# Patient Record
Sex: Female | Born: 1940 | Race: White | Hispanic: No | Marital: Married | Smoking: Current every day smoker
Health system: Southern US, Community
[De-identification: ages and names within clinical notes are randomized; demographics above are authoritative.]

## PROBLEM LIST (undated history)

## (undated) DIAGNOSIS — Z9989 Dependence on other enabling machines and devices: Secondary | ICD-10-CM

## (undated) DIAGNOSIS — E785 Hyperlipidemia, unspecified: Secondary | ICD-10-CM

## (undated) DIAGNOSIS — F17200 Nicotine dependence, unspecified, uncomplicated: Secondary | ICD-10-CM

## (undated) DIAGNOSIS — R413 Other amnesia: Principal | ICD-10-CM

## (undated) DIAGNOSIS — M1990 Unspecified osteoarthritis, unspecified site: Secondary | ICD-10-CM

## (undated) DIAGNOSIS — G250 Essential tremor: Secondary | ICD-10-CM

## (undated) DIAGNOSIS — I10 Essential (primary) hypertension: Secondary | ICD-10-CM

## (undated) DIAGNOSIS — R443 Hallucinations, unspecified: Secondary | ICD-10-CM

## (undated) DIAGNOSIS — G4733 Obstructive sleep apnea (adult) (pediatric): Secondary | ICD-10-CM

## (undated) DIAGNOSIS — F329 Major depressive disorder, single episode, unspecified: Secondary | ICD-10-CM

## (undated) DIAGNOSIS — K449 Diaphragmatic hernia without obstruction or gangrene: Secondary | ICD-10-CM

## (undated) DIAGNOSIS — I2510 Atherosclerotic heart disease of native coronary artery without angina pectoris: Secondary | ICD-10-CM

## (undated) DIAGNOSIS — R32 Unspecified urinary incontinence: Secondary | ICD-10-CM

## (undated) HISTORY — DX: Diaphragmatic hernia without obstruction or gangrene: K44.9

## (undated) HISTORY — DX: Essential (primary) hypertension: I10

## (undated) HISTORY — DX: Essential tremor: G25.0

## (undated) HISTORY — DX: Unspecified osteoarthritis, unspecified site: M19.90

## (undated) HISTORY — DX: Atherosclerotic heart disease of native coronary artery without angina pectoris: I25.10

## (undated) HISTORY — DX: Hallucinations, unspecified: R44.3

## (undated) HISTORY — PX: TRIGGER FINGER RELEASE: SHX641

## (undated) HISTORY — DX: Obstructive sleep apnea (adult) (pediatric): G47.33

## (undated) HISTORY — DX: Major depressive disorder, single episode, unspecified: F32.9

## (undated) HISTORY — PX: FOOT SURGERY: SHX648

## (undated) HISTORY — PX: TOE SURGERY: SHX1073

## (undated) HISTORY — PX: TUBAL LIGATION: SHX77

## (undated) HISTORY — DX: Unspecified urinary incontinence: R32

## (undated) HISTORY — DX: Hyperlipidemia, unspecified: E78.5

## (undated) HISTORY — DX: Other amnesia: R41.3

## (undated) HISTORY — DX: Dependence on other enabling machines and devices: Z99.89

## (undated) HISTORY — DX: Nicotine dependence, unspecified, uncomplicated: F17.200

---

## 2000-07-21 ENCOUNTER — Other Ambulatory Visit: Admission: RE | Admit: 2000-07-21 | Discharge: 2000-07-21 | Payer: Self-pay | Admitting: Emergency Medicine

## 2000-07-21 ENCOUNTER — Encounter: Payer: Self-pay | Admitting: Emergency Medicine

## 2000-07-21 ENCOUNTER — Inpatient Hospital Stay (HOSPITAL_COMMUNITY): Admission: EM | Admit: 2000-07-21 | Discharge: 2000-07-22 | Payer: Self-pay | Admitting: Emergency Medicine

## 2000-07-22 ENCOUNTER — Encounter: Payer: Self-pay | Admitting: Cardiology

## 2000-07-31 ENCOUNTER — Encounter: Admission: RE | Admit: 2000-07-31 | Discharge: 2000-07-31 | Payer: Self-pay | Admitting: Emergency Medicine

## 2000-07-31 ENCOUNTER — Encounter: Payer: Self-pay | Admitting: Emergency Medicine

## 2000-12-25 ENCOUNTER — Ambulatory Visit (HOSPITAL_COMMUNITY): Admission: RE | Admit: 2000-12-25 | Discharge: 2000-12-25 | Payer: Self-pay | Admitting: Gastroenterology

## 2000-12-25 ENCOUNTER — Encounter (INDEPENDENT_AMBULATORY_CARE_PROVIDER_SITE_OTHER): Payer: Self-pay

## 2001-01-26 ENCOUNTER — Encounter (INDEPENDENT_AMBULATORY_CARE_PROVIDER_SITE_OTHER): Payer: Self-pay | Admitting: Specialist

## 2001-01-26 ENCOUNTER — Ambulatory Visit (HOSPITAL_COMMUNITY): Admission: RE | Admit: 2001-01-26 | Discharge: 2001-01-26 | Payer: Self-pay | Admitting: Gastroenterology

## 2001-07-27 ENCOUNTER — Ambulatory Visit (HOSPITAL_COMMUNITY): Admission: RE | Admit: 2001-07-27 | Discharge: 2001-07-27 | Payer: Self-pay | Admitting: Gastroenterology

## 2001-07-27 ENCOUNTER — Encounter (INDEPENDENT_AMBULATORY_CARE_PROVIDER_SITE_OTHER): Payer: Self-pay | Admitting: *Deleted

## 2001-12-24 ENCOUNTER — Encounter: Payer: Self-pay | Admitting: Unknown Physician Specialty

## 2001-12-24 ENCOUNTER — Encounter: Admission: RE | Admit: 2001-12-24 | Discharge: 2001-12-24 | Payer: Self-pay | Admitting: Unknown Physician Specialty

## 2002-05-10 ENCOUNTER — Encounter: Payer: Self-pay | Admitting: *Deleted

## 2002-05-10 ENCOUNTER — Encounter: Admission: RE | Admit: 2002-05-10 | Discharge: 2002-05-10 | Payer: Self-pay | Admitting: *Deleted

## 2007-07-22 ENCOUNTER — Ambulatory Visit: Payer: Self-pay | Admitting: Family Medicine

## 2007-09-17 ENCOUNTER — Ambulatory Visit: Payer: Self-pay | Admitting: Family Medicine

## 2007-09-17 ENCOUNTER — Other Ambulatory Visit: Admission: RE | Admit: 2007-09-17 | Discharge: 2007-09-17 | Payer: Self-pay | Admitting: Family Medicine

## 2007-09-17 ENCOUNTER — Encounter: Payer: Self-pay | Admitting: Family Medicine

## 2007-09-17 LAB — HM PAP SMEAR: HM Pap smear: NEGATIVE

## 2007-11-06 ENCOUNTER — Ambulatory Visit: Payer: Self-pay | Admitting: Family Medicine

## 2008-01-04 ENCOUNTER — Ambulatory Visit: Payer: Self-pay | Admitting: Family Medicine

## 2008-01-04 ENCOUNTER — Encounter: Admission: RE | Admit: 2008-01-04 | Discharge: 2008-01-04 | Payer: Self-pay | Admitting: Family Medicine

## 2008-03-14 ENCOUNTER — Encounter: Admission: RE | Admit: 2008-03-14 | Discharge: 2008-03-14 | Payer: Self-pay | Admitting: Gastroenterology

## 2008-03-31 ENCOUNTER — Encounter: Admission: RE | Admit: 2008-03-31 | Discharge: 2008-03-31 | Payer: Self-pay | Admitting: Family Medicine

## 2008-03-31 ENCOUNTER — Ambulatory Visit: Payer: Self-pay | Admitting: Family Medicine

## 2008-04-08 ENCOUNTER — Ambulatory Visit: Payer: Self-pay | Admitting: Cardiology

## 2008-04-08 ENCOUNTER — Encounter: Payer: Self-pay | Admitting: Cardiology

## 2008-04-08 DIAGNOSIS — E785 Hyperlipidemia, unspecified: Secondary | ICD-10-CM

## 2008-04-08 DIAGNOSIS — R0602 Shortness of breath: Secondary | ICD-10-CM

## 2008-04-08 DIAGNOSIS — E669 Obesity, unspecified: Secondary | ICD-10-CM

## 2008-04-08 DIAGNOSIS — R609 Edema, unspecified: Secondary | ICD-10-CM | POA: Insufficient documentation

## 2008-04-08 DIAGNOSIS — I359 Nonrheumatic aortic valve disorder, unspecified: Secondary | ICD-10-CM | POA: Insufficient documentation

## 2008-04-08 DIAGNOSIS — I10 Essential (primary) hypertension: Secondary | ICD-10-CM | POA: Insufficient documentation

## 2008-04-08 LAB — CONVERTED CEMR LAB: Pro B Natriuretic peptide (BNP): 80 pg/mL (ref 0.0–100.0)

## 2008-04-14 ENCOUNTER — Ambulatory Visit: Payer: Self-pay | Admitting: Family Medicine

## 2008-04-27 ENCOUNTER — Ambulatory Visit: Payer: Self-pay | Admitting: Family Medicine

## 2008-05-05 ENCOUNTER — Telehealth (INDEPENDENT_AMBULATORY_CARE_PROVIDER_SITE_OTHER): Payer: Self-pay

## 2008-05-09 ENCOUNTER — Encounter: Payer: Self-pay | Admitting: Cardiology

## 2008-05-09 ENCOUNTER — Ambulatory Visit: Payer: Self-pay

## 2008-05-09 ENCOUNTER — Ambulatory Visit: Payer: Self-pay | Admitting: Cardiology

## 2008-05-09 ENCOUNTER — Inpatient Hospital Stay (HOSPITAL_COMMUNITY): Admission: AD | Admit: 2008-05-09 | Discharge: 2008-05-11 | Payer: Self-pay | Admitting: Cardiology

## 2008-05-27 ENCOUNTER — Ambulatory Visit: Payer: Self-pay | Admitting: Cardiology

## 2008-05-27 ENCOUNTER — Encounter: Payer: Self-pay | Admitting: Cardiology

## 2008-06-03 ENCOUNTER — Ambulatory Visit: Payer: Self-pay | Admitting: Family Medicine

## 2008-08-26 ENCOUNTER — Ambulatory Visit: Payer: Self-pay | Admitting: Cardiology

## 2008-08-26 DIAGNOSIS — R5383 Other fatigue: Secondary | ICD-10-CM

## 2008-08-26 DIAGNOSIS — R5381 Other malaise: Secondary | ICD-10-CM

## 2008-08-26 DIAGNOSIS — I2510 Atherosclerotic heart disease of native coronary artery without angina pectoris: Secondary | ICD-10-CM | POA: Insufficient documentation

## 2008-08-30 ENCOUNTER — Ambulatory Visit: Payer: Self-pay | Admitting: Cardiology

## 2008-09-01 LAB — CONVERTED CEMR LAB
Albumin: 3.7 g/dL (ref 3.5–5.2)
Cholesterol: 168 mg/dL (ref 0–200)
Total CHOL/HDL Ratio: 4

## 2008-10-13 ENCOUNTER — Ambulatory Visit: Payer: Self-pay | Admitting: Family Medicine

## 2008-10-31 ENCOUNTER — Ambulatory Visit: Payer: Self-pay | Admitting: Cardiovascular Disease

## 2008-10-31 ENCOUNTER — Inpatient Hospital Stay (HOSPITAL_COMMUNITY): Admission: EM | Admit: 2008-10-31 | Discharge: 2008-11-01 | Payer: Self-pay | Admitting: Emergency Medicine

## 2008-11-03 ENCOUNTER — Ambulatory Visit: Payer: Self-pay | Admitting: Family Medicine

## 2008-11-15 ENCOUNTER — Encounter: Payer: Self-pay | Admitting: Cardiology

## 2008-11-16 ENCOUNTER — Ambulatory Visit: Payer: Self-pay | Admitting: Cardiology

## 2008-11-16 ENCOUNTER — Ambulatory Visit: Payer: Self-pay | Admitting: Cardiovascular Disease

## 2008-11-18 LAB — CONVERTED CEMR LAB
GFR calc non Af Amer: 58.91 mL/min (ref >60)
Potassium: 4.3 meq/L (ref 3.5–5.1)

## 2009-01-04 ENCOUNTER — Ambulatory Visit: Payer: Self-pay | Admitting: Family Medicine

## 2009-01-05 ENCOUNTER — Ambulatory Visit: Payer: Self-pay | Admitting: Cardiology

## 2009-02-14 ENCOUNTER — Ambulatory Visit: Payer: Self-pay | Admitting: Family Medicine

## 2009-02-22 ENCOUNTER — Ambulatory Visit: Payer: Self-pay | Admitting: Family Medicine

## 2009-03-08 ENCOUNTER — Ambulatory Visit: Payer: Self-pay | Admitting: Family Medicine

## 2009-04-10 ENCOUNTER — Ambulatory Visit: Payer: Self-pay | Admitting: Family Medicine

## 2009-08-10 ENCOUNTER — Ambulatory Visit: Payer: Self-pay | Admitting: Family Medicine

## 2009-08-16 ENCOUNTER — Ambulatory Visit: Payer: Self-pay | Admitting: Family Medicine

## 2009-09-06 ENCOUNTER — Ambulatory Visit: Payer: Self-pay | Admitting: Family Medicine

## 2009-09-15 ENCOUNTER — Ambulatory Visit: Payer: Self-pay | Admitting: Family Medicine

## 2009-09-28 LAB — HM DEXA SCAN

## 2009-11-06 ENCOUNTER — Ambulatory Visit: Payer: Self-pay | Admitting: Family Medicine

## 2009-12-01 ENCOUNTER — Ambulatory Visit: Payer: Self-pay | Admitting: Family Medicine

## 2009-12-08 ENCOUNTER — Ambulatory Visit: Payer: Self-pay | Admitting: Family Medicine

## 2009-12-19 ENCOUNTER — Ambulatory Visit: Payer: Self-pay | Admitting: Cardiology

## 2009-12-19 DIAGNOSIS — E663 Overweight: Secondary | ICD-10-CM | POA: Insufficient documentation

## 2009-12-19 DIAGNOSIS — F17200 Nicotine dependence, unspecified, uncomplicated: Secondary | ICD-10-CM

## 2009-12-20 ENCOUNTER — Ambulatory Visit: Payer: Self-pay | Admitting: Cardiology

## 2009-12-20 DIAGNOSIS — E7800 Pure hypercholesterolemia, unspecified: Secondary | ICD-10-CM | POA: Insufficient documentation

## 2009-12-27 ENCOUNTER — Encounter: Payer: Self-pay | Admitting: Cardiology

## 2009-12-27 LAB — CONVERTED CEMR LAB
AST: 19 units/L (ref 0–37)
Albumin: 3.8 g/dL (ref 3.5–5.2)
Alkaline Phosphatase: 115 units/L (ref 39–117)
Bilirubin, Direct: 0.1 mg/dL (ref 0.0–0.3)
HDL: 43.4 mg/dL (ref >39.00)
LDL Cholesterol: 90 mg/dL (ref 0–99)
Total Bilirubin: 0.8 mg/dL (ref 0.3–1.2)
Total CHOL/HDL Ratio: 4
Total Protein: 6.4 g/dL (ref 6.0–8.3)
VLDL: 35.4 mg/dL (ref 0.0–40.0)

## 2010-01-08 ENCOUNTER — Ambulatory Visit: Payer: Self-pay | Admitting: Family Medicine

## 2010-01-29 ENCOUNTER — Ambulatory Visit: Payer: Self-pay | Admitting: Family Medicine

## 2010-01-29 ENCOUNTER — Encounter
Admission: RE | Admit: 2010-01-29 | Discharge: 2010-01-29 | Payer: Self-pay | Source: Home / Self Care | Attending: Family Medicine | Admitting: Family Medicine

## 2010-02-21 IMAGING — CR DG CHEST 2V
2 series · 2 of 2 positions shown · non-contrast
Comparison: 03/31/2008

CLINICAL DATA: Shortness of breath

CHEST - 2 VIEW

[w chest pa *]
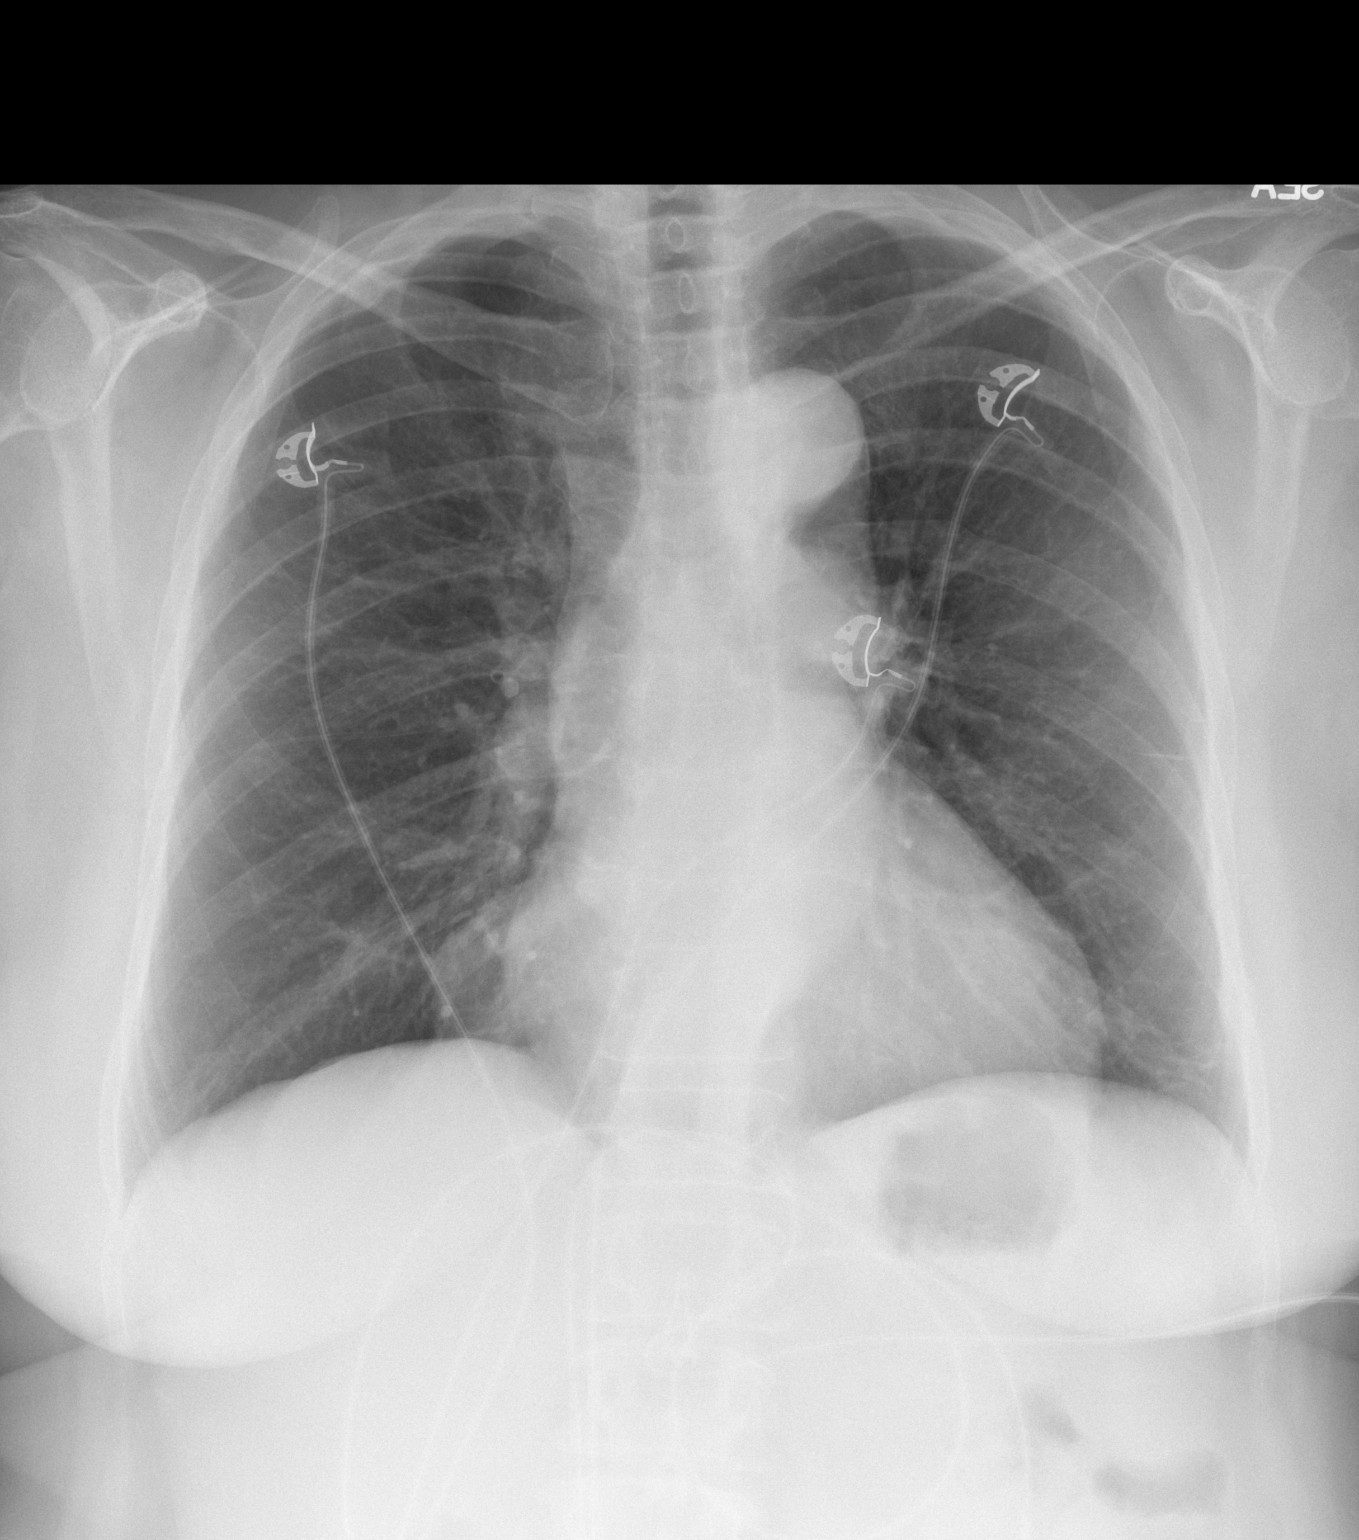

[w chest lat *]
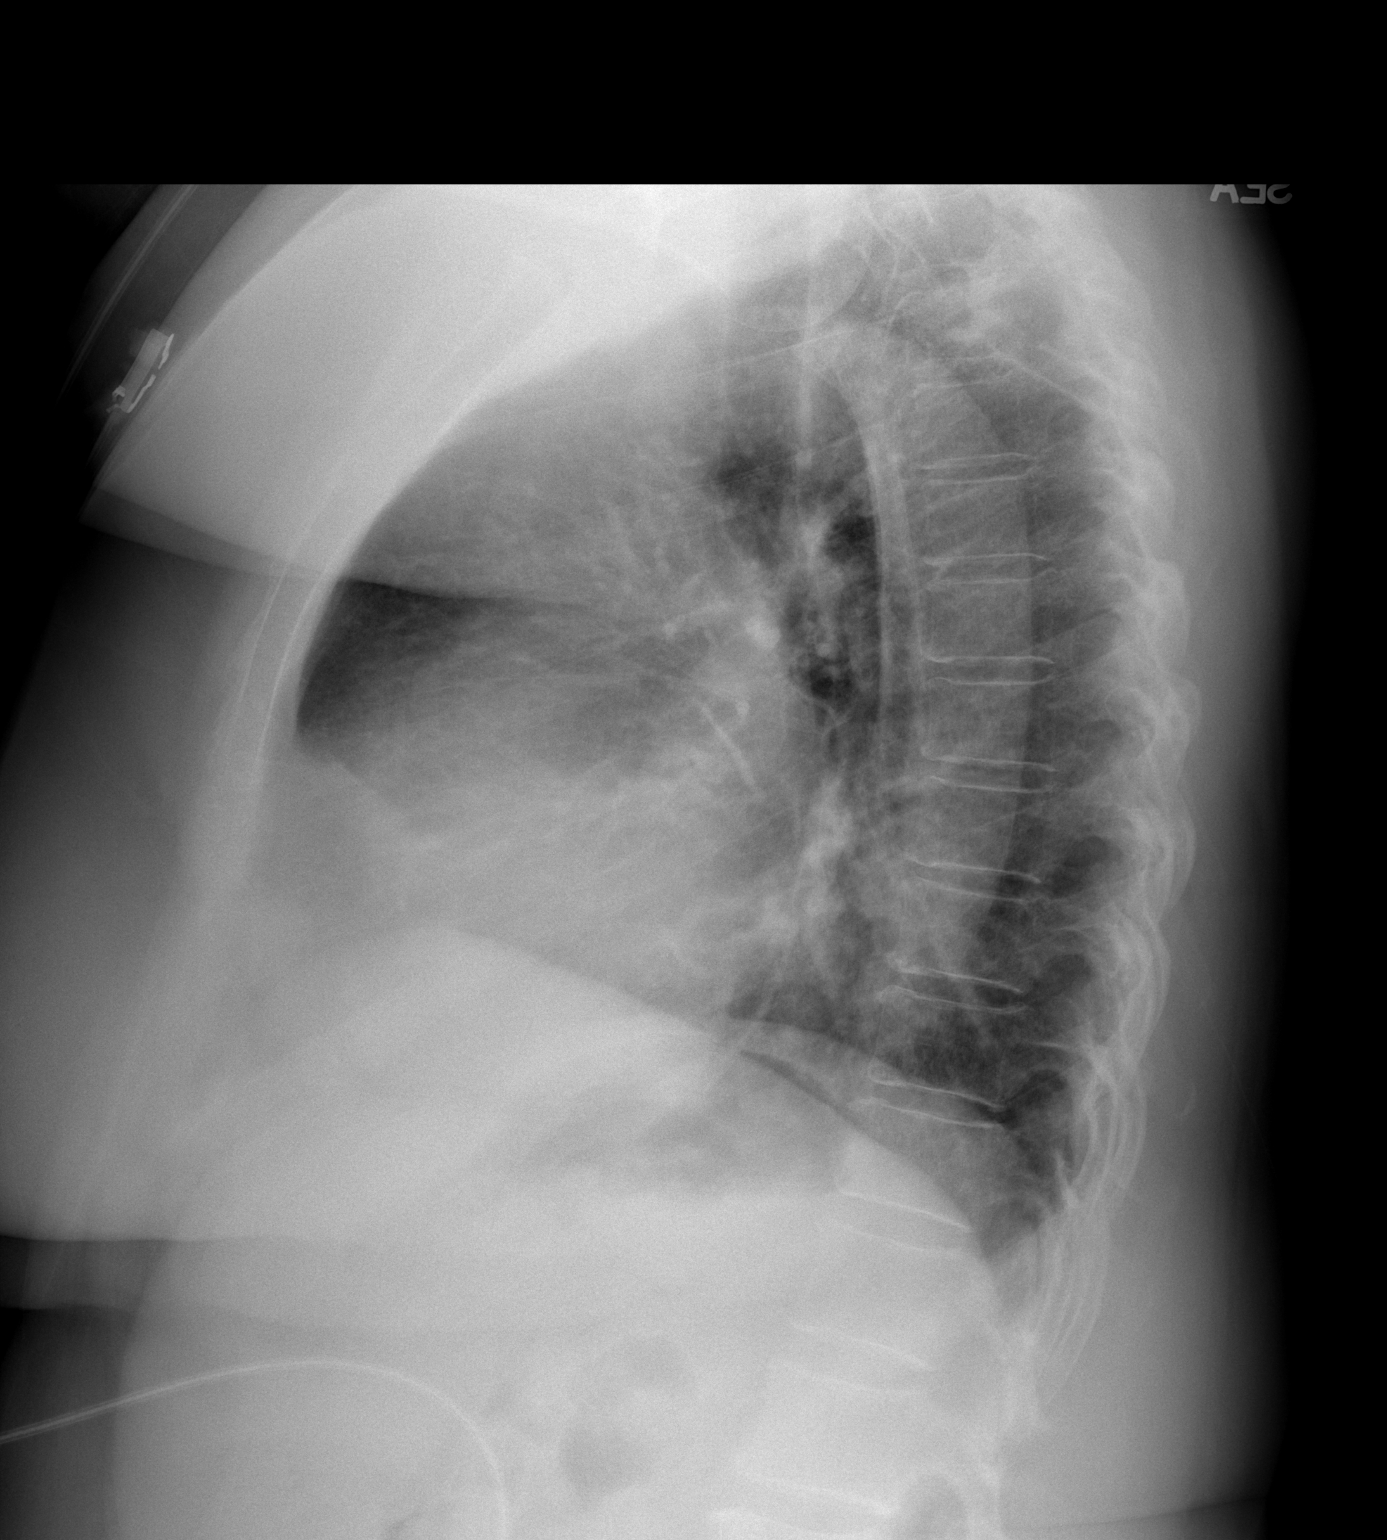

[2 of 2 positions shown; findings below may reference images not displayed]

FINDINGS: There is minimal linear scarring or atelectasis in the
left lower lobe as before.  Lungs otherwise clear.  Tortuous
thoracic aorta.  Heart size upper limits normal.  No effusion.
Visualized bones unremarkable.
IMPRESSION: No acute disease.

## 2010-02-22 ENCOUNTER — Ambulatory Visit (HOSPITAL_COMMUNITY)
Admission: RE | Admit: 2010-02-22 | Discharge: 2010-02-22 | Payer: Self-pay | Source: Home / Self Care | Attending: Psychiatry | Admitting: Psychiatry

## 2010-03-20 NOTE — Letter (Signed)
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 [address]   [place], [place] [address]   Phone: [phone]  Fax: [phone]         December 27, 2009 MRN: [id number]   [name] [address] [place], [place]  [address]   Dear Ms. Skeens,  We have reviewed your cholesterol results.  They are as follows:     Total Cholesterol:    169 (Desirable: less than 200)       HDL  Cholesterol:     43.40  (Desirable: greater than 40 for men and 50 for women)       LDL Cholesterol:       90  (Desirable: less than 100 for low risk and less than 70 for moderate to high risk)       Triglycerides:       177.0  (Desirable: less than 150)  Our recommendations include:  No changes, continue current treatment   Call our office at the number listed above if you have any questions.  Lowering your LDL cholesterol is important, but it is only one of a large number of "risk factors" that may indicate that you are at risk for heart disease, stroke or other complications of hardening of the arteries.  Other risk factors include:   A.  Cigarette Smoking* B.  High Blood Pressure* C.  Obesity* D.   Low HDL Cholesterol (see yours above)* E.   Diabetes Mellitus (higher risk if your is uncontrolled) F.  Family history of premature heart disease G.  Previous history of stroke or cardiovascular disease    *These are risk factors YOU HAVE CONTROL OVER.  For more information, visit .        There is now evidence that lowering the TOTAL CHOLESTEROL AND LDL CHOLESTEROL can reduce the risk of heart disease.  The American Heart Association recommends the following guidelines for the treatment of elevated cholesterol:  1.  If there is now current heart disease and less than two risk factors, TOTAL CHOLESTEROL should be less than 200 and LDL CHOLESTEROL should be less than 100. 2.  If there is current heart disease or two or more risk factors, TOTAL CHOLESTEROL should be less than 200  and LDL CHOLESTEROL should be less than 70.  A diet low in cholesterol, saturated fat, and calories is the cornerstone of treatment for elevated cholesterol.  Cessation of smoking and exercise are also important in the management of elevated cholesterol and preventing vascular disease.  Studies have shown that 30 to 60 minutes of physical activity most days can help lower blood pressure, lower cholesterol, and keep your weight at a healthy level.  Drug therapy is used when cholesterol levels do not respond to therapeutic lifestyle changes (smoking cessation, diet, and exercise) and remains unacceptably high.  If medication is started, it is important to have you levels checked periodically to evaluate the need for further treatment options.      Thank you,     Sander Nephew, RN for  Dr Rollene Rotunda [facility] Team

## 2010-03-20 NOTE — Assessment & Plan Note (Signed)
Summary: abn EKG per Feliberto Gottron, PA   Visit Type:  Follow-up Primary Provider:  Sharlot Gowda  CC:  CAD.  History of Present Illness: The patient presents for followup after a recently developing some palpitations and back pain. She said that she was having some kind of an upper respiratory infection and took some cold medications with cough suppressant and Benadryl. That night she had palpitations. She did not have any presyncope or syncope. She had palpitations again the next day. She presented to her primary care and also describe some pain in her back. This was unlike her angina and similar to previous pain she had with emotional stress. This all happened about 2 weeks ago. Since then she has been started on antibiotics. She has had no chest pressure or palpitations.  She had no associated symptoms. She's had no new shortness of breath, PND or orthopnea. She walks her dog for about an hour and a half every day. Unfortunately she started smoking cigarettes again.  Current Medications (verified): 1)  Atenolol-Chlorthalidone 50-25 Mg Tabs (Atenolol-Chlorthalidone) .... One By Mouth Daily 2)  Omeprazole 20 Mg Cpdr (Omeprazole) .... One By Mouth Daily 3)  Calcium 600mg  .... One By Mouth Two Times A Day 4)  Vitamin D-3 2000 Iu .... One By Mouth Daily 5)  Omega-3 1200 Mg .... One By Mouth Bid 6)  Niacin 500 Mg Tabs (Niacin) .... One By Mouth Two Times A Day 7)  Lipitor 20 Mg Tabs (Atorvastatin Calcium) .... One By Mouth Daily 8)  Centrum Cardio Mvi .... One By Mouth Bid 9)  Tylenol Arthritis Pain Relief 650 Mg .... Two Tablets By Mouth Daily 10)  Nitrostat 0.4 Mg Subl (Nitroglycerin) .Marland Kitchen.. 1 By Mouth As Needed 11)  Potassium Chloride 20 Meq Pack (Potassium Chloride) .... Take One Daily 12)  Aspir-Low 81 Mg Tbec (Aspirin) .... Take One Daily 13)  Bupropion Hcl 150 Mg Xr24h-Tab (Bupropion Hcl) .... One Daily  Allergies (verified): 1)  ! Asa  Past History:  Past Medical  History: COPD GERD Hiatal Hernia HTN since 1995 Hyperlidiemia for years CAD (distal circumflex occlusion. Nonobstructive disease elsewhere. March 2010) Tobacco abuse (ongoing)  Past Surgical History: Reviewed history from 04/08/2008 and no changes required. Tubal Ligation Foot Surgery  Review of Systems       As stated in the HPI and negative for all other systems.   Vital Signs:  Patient profile:   70 year old female Height:      63 inches Weight:      163 pounds BMI:     28.98 Pulse rate:   70 / minute Resp:     16 per minute BP sitting:   118 / 70  (left arm)  Vitals Entered By: Marrion Coy, CNA (December 19, 2009 10:44 AM)  Physical Exam  General:  Well developed, well nourished, in no acute distress. Head:  normocephalic and atraumatic Eyes:  PERRLA/EOM intact; conjunctiva and lids normal. Neck:  Neck supple, no JVD. No masses, thyromegaly or abnormal cervical nodes. Chest Wall:  no deformities or breast masses noted Lungs:  Clear bilaterally to auscultation and percussion. Heart:  Non-displaced PMI, chest non-tender; regular rate and rhythm, S1, S2 without murmurs, rubs or gallops. Carotid upstroke normal, no bruit. Normal abdominal aortic size, no bruits. Femorals normal pulses, no bruits. Pedals normal pulses. No edema, no varicosities. Abdomen:  Bowel sounds positive; abdomen soft and non-tender without masses, organomegaly, or hernias noted. No hepatosplenomegaly. Msk:  Back normal, normal gait.  Muscle strength and tone normal. Extremities:  No clubbing or cyanosis. Neurologic:  Alert and oriented x 3. Skin:  Intact without lesions or rashes. Cervical Nodes:  no significant adenopathy Inguinal Nodes:  no significant adenopathy Psych:  Normal affect.   EKG  Procedure date:  12/08/2009  Findings:      Sinus rhythm, rate 63, axis within normal limits, intervals within normal limits, early transition in lead V1 through V2, nonspecific T-wave  flattening  Impression & Recommendations:  Problem # 1:  CAD (ICD-414.00) The patient has no new anginal symptoms. At this point no further cardiovascular testing is suggested. We will pursue aggressive risk reduction. Orders: EKG w/ Interpretation (93000)  Problem # 2:  HYPERLIPIDEMIA (ICD-272.4) I do not see a recent lipid profile and so she will come back tomorrow with a goal LDL less than 100 and HDL greater than 50.  Problem # 3:  OVERWEIGHT (ICD-278.02) She has lost greater than 30 pounds because of stress. We have discussed appropriate weight loss and diet.  Problem # 4:  TOBACCO ABUSE (ICD-305.1) We again discussed the need to stop smoking (greater than 3 minutes). Hopefully now that she is back on Wellbutrin she will be able to do this. She quit once.  Patient Instructions: 1)  Your physician recommends that you schedule a follow-up appointment in: 12 months with Dr Antoine Poche 2)  Your physician recommends that you return for a FASTING lipid and liver profile: 12/20/2009 3)  Your physician recommends that you continue on your current medications as directed. Please refer to the Current Medication list given to you today.

## 2010-04-09 ENCOUNTER — Encounter (HOSPITAL_BASED_OUTPATIENT_CLINIC_OR_DEPARTMENT_OTHER)
Admission: RE | Admit: 2010-04-09 | Discharge: 2010-04-09 | Disposition: A | Discharge status: Home / Self Care | Payer: Medicare Other | Source: Ambulatory Visit | Attending: Orthopedic Surgery | Admitting: Orthopedic Surgery

## 2010-04-09 DIAGNOSIS — Z01812 Encounter for preprocedural laboratory examination: Secondary | ICD-10-CM | POA: Insufficient documentation

## 2010-04-09 LAB — BASIC METABOLIC PANEL
CO2: 29 mEq/L (ref 19–32)
Calcium: 10 mg/dL (ref 8.4–10.5)
Chloride: 103 mEq/L (ref 96–112)
Creatinine, Ser: 0.97 mg/dL (ref 0.4–1.2)
GFR calc Af Amer: >60 mL/min (ref >60)
GFR calc non Af Amer: 57 mL/min — ABNORMAL LOW (ref >60)
Potassium: 4.3 mEq/L (ref 3.5–5.1)
Sodium: 139 mEq/L (ref 135–145)

## 2010-04-11 ENCOUNTER — Ambulatory Visit (HOSPITAL_BASED_OUTPATIENT_CLINIC_OR_DEPARTMENT_OTHER)
Admission: RE | Admit: 2010-04-11 | Discharge: 2010-04-11 | Disposition: A | Discharge status: Home / Self Care | Payer: Medicare Other | Source: Ambulatory Visit | Attending: Orthopedic Surgery | Admitting: Orthopedic Surgery

## 2010-04-11 DIAGNOSIS — Z01818 Encounter for other preprocedural examination: Secondary | ICD-10-CM | POA: Insufficient documentation

## 2010-04-11 DIAGNOSIS — J449 Chronic obstructive pulmonary disease, unspecified: Secondary | ICD-10-CM | POA: Insufficient documentation

## 2010-04-11 DIAGNOSIS — Z01812 Encounter for preprocedural laboratory examination: Secondary | ICD-10-CM | POA: Insufficient documentation

## 2010-04-11 DIAGNOSIS — M6530 Trigger finger, unspecified finger: Secondary | ICD-10-CM | POA: Insufficient documentation

## 2010-04-11 DIAGNOSIS — M65839 Other synovitis and tenosynovitis, unspecified forearm: Secondary | ICD-10-CM | POA: Insufficient documentation

## 2010-04-11 DIAGNOSIS — F17200 Nicotine dependence, unspecified, uncomplicated: Secondary | ICD-10-CM | POA: Insufficient documentation

## 2010-04-11 DIAGNOSIS — G5620 Lesion of ulnar nerve, unspecified upper limb: Secondary | ICD-10-CM | POA: Insufficient documentation

## 2010-04-11 DIAGNOSIS — J4489 Other specified chronic obstructive pulmonary disease: Secondary | ICD-10-CM | POA: Insufficient documentation

## 2010-04-11 DIAGNOSIS — I2510 Atherosclerotic heart disease of native coronary artery without angina pectoris: Secondary | ICD-10-CM | POA: Insufficient documentation

## 2010-04-11 DIAGNOSIS — I10 Essential (primary) hypertension: Secondary | ICD-10-CM | POA: Insufficient documentation

## 2010-04-11 LAB — POCT HEMOGLOBIN-HEMACUE: Hemoglobin: 14.8 g/dL (ref 12.0–15.0)

## 2010-05-25 LAB — COMPREHENSIVE METABOLIC PANEL
ALT: 18 U/L (ref 0–35)
AST: 22 U/L (ref 0–37)
Albumin: 3.9 g/dL (ref 3.5–5.2)
Alkaline Phosphatase: 86 U/L (ref 39–117)
BUN: 10 mg/dL (ref 6–23)
Calcium: 9.6 mg/dL (ref 8.4–10.5)
Chloride: 103 mEq/L (ref 96–112)
Glucose, Bld: 127 mg/dL — ABNORMAL HIGH (ref 70–99)
Potassium: 2.7 mEq/L — CL (ref 3.5–5.1)
Total Bilirubin: 0.4 mg/dL (ref 0.3–1.2)
Total Protein: 6.6 g/dL (ref 6.0–8.3)

## 2010-05-25 LAB — POCT CARDIAC MARKERS
CKMB, poc: 2 ng/mL (ref 1.0–8.0)
Troponin i, poc: <0.05 ng/mL (ref 0.00–0.09)

## 2010-05-25 LAB — HEPARIN LEVEL (UNFRACTIONATED): Heparin Unfractionated: 0.69 IU/mL (ref 0.30–0.70)

## 2010-05-25 LAB — CBC
MCHC: 34.3 g/dL (ref 30.0–36.0)
MCHC: 34.5 g/dL (ref 30.0–36.0)
MCV: 87 fL (ref 78.0–100.0)
Platelets: 174 10*3/uL (ref 150–400)
RBC: 3.98 MIL/uL (ref 3.87–5.11)
WBC: 6.2 10*3/uL (ref 4.0–10.5)
WBC: 9.7 10*3/uL (ref 4.0–10.5)

## 2010-05-25 LAB — BASIC METABOLIC PANEL
Calcium: 9.5 mg/dL (ref 8.4–10.5)
GFR calc Af Amer: >60 mL/min (ref >60)
Glucose, Bld: 102 mg/dL — ABNORMAL HIGH (ref 70–99)

## 2010-05-25 LAB — CARDIAC PANEL(CRET KIN+CKTOT+MB+TROPI): Troponin I: 0.02 ng/mL (ref 0.00–0.06)

## 2010-05-25 LAB — LIPID PANEL
HDL: 54 mg/dL (ref >39)
Triglycerides: 120 mg/dL (ref <150)
VLDL: 24 mg/dL (ref 0–40)

## 2010-05-25 LAB — DIFFERENTIAL
Lymphs Abs: 1.5 10*3/uL (ref 0.7–4.0)
Monocytes Absolute: 0.5 10*3/uL (ref 0.1–1.0)
Neutrophils Relative %: 66 % (ref 43–77)

## 2010-05-25 LAB — APTT: aPTT: 29 seconds (ref 24–37)

## 2010-05-31 LAB — BASIC METABOLIC PANEL
BUN: 10 mg/dL (ref 6–23)
CO2: 28 mEq/L (ref 19–32)
Calcium: 8.7 mg/dL (ref 8.4–10.5)
Chloride: 106 mEq/L (ref 96–112)
Creatinine, Ser: 1.04 mg/dL (ref 0.4–1.2)
GFR calc Af Amer: >60 mL/min (ref >60)
Glucose, Bld: 104 mg/dL — ABNORMAL HIGH (ref 70–99)

## 2010-05-31 LAB — POCT I-STAT 3, VENOUS BLOOD GAS (G3P V)
O2 Saturation: 70 %
pCO2, Ven: 42.2 mmHg — ABNORMAL LOW (ref 45.0–50.0)
pH, Ven: 7.38 — ABNORMAL HIGH (ref 7.250–7.300)
pO2, Ven: 38 mmHg (ref 30.0–45.0)

## 2010-05-31 LAB — COMPREHENSIVE METABOLIC PANEL
ALT: 23 U/L (ref 0–35)
CO2: 28 mEq/L (ref 19–32)
Calcium: 9.7 mg/dL (ref 8.4–10.5)
Creatinine, Ser: 0.99 mg/dL (ref 0.4–1.2)
GFR calc non Af Amer: 56 mL/min — ABNORMAL LOW (ref >60)
Glucose, Bld: 136 mg/dL — ABNORMAL HIGH (ref 70–99)
Sodium: 137 mEq/L (ref 135–145)
Total Bilirubin: 0.8 mg/dL (ref 0.3–1.2)

## 2010-05-31 LAB — POCT I-STAT 3, ART BLOOD GAS (G3+)
Bicarbonate: 26 mEq/L — ABNORMAL HIGH (ref 20.0–24.0)
O2 Saturation: 93 %
TCO2: 27 mmol/L (ref 0–100)
pCO2 arterial: 41.3 mmHg (ref 35.0–45.0)
pO2, Arterial: 65 mmHg — ABNORMAL LOW (ref 80.0–100.0)

## 2010-05-31 LAB — DIFFERENTIAL
Basophils Absolute: 0.1 10*3/uL (ref 0.0–0.1)
Basophils Relative: 1 % (ref 0–1)
Eosinophils Relative: 1 % (ref 0–5)
Monocytes Absolute: 0.9 10*3/uL (ref 0.1–1.0)

## 2010-05-31 LAB — CBC
HCT: 37.4 % (ref 36.0–46.0)
Hemoglobin: 12.9 g/dL (ref 12.0–15.0)
MCHC: 34.6 g/dL (ref 30.0–36.0)
MCHC: 35 g/dL (ref 30.0–36.0)
MCV: 86.5 fL (ref 78.0–100.0)
RDW: 13.4 % (ref 11.5–15.5)
RDW: 13.4 % (ref 11.5–15.5)

## 2010-05-31 LAB — CARDIAC PANEL(CRET KIN+CKTOT+MB+TROPI)
Relative Index: 4.5 — ABNORMAL HIGH (ref 0.0–2.5)
Relative Index: 4.7 — ABNORMAL HIGH (ref 0.0–2.5)
Troponin I: 4.9 ng/mL (ref 0.00–0.06)

## 2010-06-15 ENCOUNTER — Telehealth: Payer: Self-pay | Admitting: Cardiology

## 2010-06-15 NOTE — Telephone Encounter (Signed)
Cath,Stress fxed to [place] @ [phone]

## 2010-07-03 NOTE — Cardiovascular Report (Signed)
NAMESHALONA, HARBOUR NO.:  [id number]   MEDICAL RECORD NO.:  [id number]          PATIENT TYPE:  INP   LOCATION:  3743                         FACILITY:  [facility]   PHYSICIAN:  Rollene Rotunda, MD, FACCDATE OF BIRTH:  04-04-1940   DATE OF PROCEDURE:  05/10/2008  DATE OF DISCHARGE:                            CARDIAC CATHETERIZATION   PRIMARY CARE PHYSICIAN:  Sharlot Gowda, MD   CARDIOLOGIST:  Rollene Rotunda, MD, [facility]   PROCEDURE:  Left and right heart catheterization/coronary arteriography.   INDICATION:  Evaluate patient with a non-Q-wave myocardial infarction.  She presented initially with predominately dyspnea.  She has had a  stress perfusion study which demonstrated no ischemia on perfusion  images.  However, she had T-wave changes and chest pain.  She  subsequently ruled in for non-Q-wave myocardial infarction with elevated  enzymes.  The patient also has lower extremity edema.   PROCEDURE NOTE:  Left heart catheterization was performed via right  femoral artery.  Right heart catheterization performed via the right  femoral vein.  Both vessels were cannulated using anterior wall  puncture.  A #5-French arterial sheath and a #7-French venous sheath  were inserted via modified Seldinger technique.  Preformed Judkins,  pigtail, and a Swan-Ganz catheter were utilized.  The patient tolerated  the procedure well and left lab in stable condition.   RESULTS:  Hemodynamics:  RA mean 6, RV 34/1, PA 36/7 with a mean of 20,  pulmonary capillary wedge pressure mean of 11, LV 121/4, AO 126/64,  cardiac output/cardiac index 6.3/3.3.  Coronaries:  Left main was  normal.  The LAD had proximal diffuse 30-40% stenosis.  The mid distal  LAD had a long 60% lesion with diffuse nonobstructive disease in the  distal and apical vessel.  First diagonal was a small-to-moderate size  vessel with ostial 50% stenosis.  The ramus intermediate was large with  mid long 30% stenosis.  The  circumflex in the AV groove was occluded in  the distal circumflex, so this was a very small vessel.  Right coronary  artery was a large dominant vessel.  There were proximal tandem 25%  lesions.  There were mid luminal irregularities.  PDA was moderate size  and normal.   Left ventriculogram:  Left ventriculogram was obtained in the RAO  projection.  The EF was 65% with normal wall motion.   CONCLUSION:  Predominantly nonobstructive disease with distal circumflex  occlusion.  Normal right-sided wedge pressures.  There was normal left  ventricular end-diastolic pressure.   PLAN:  The patient will have aggressive medical management.      Rollene Rotunda, MD, [facility]  Electronically Signed     JH/MEDQ  D:  05/10/2008  T:  05/11/2008  Job:  [id number]   cc:   Sharlot Gowda, M.D.

## 2010-07-03 NOTE — Discharge Summary (Signed)
[name], [name] NO.:  [id number]   MEDICAL RECORD NO.:  [id number]          PATIENT TYPE:  INP   LOCATION:  3743                         FACILITY:  [facility]   PHYSICIAN:  Rollene Rotunda, MD, FACCDATE OF BIRTH:  [birth year]   DATE OF ADMISSION:  05/09/2008  DATE OF DISCHARGE:  05/11/2008                               DISCHARGE SUMMARY   PRIMARY CARDIOLOGIST:  Maisie Fus D. Riley Kill, MD, [facility]   PRIMARY CARE PHYSICIAN:  Sharlot Gowda, MD   PROCEDURES PERFORMED DURING HOSPITALIZATION:  Cardiac catheterization  completed by Dr. Rollene Rotunda, on May 10, 2008.  A.  Predominantly, nonobstructive disease with distal circumflex  occlusion.  This is a very small vessel treated with aggressive medical  management.   FINAL DISCHARGE DIAGNOSES:  1. Non-ST elevated myocardial infarction.  2. Coronary artery disease.  2A:  Status post cardiac catheterization on May 10, 2008, revealing  LAD proximal diffuse 30-40% stenosis in mid-distal LAD, had long 6%  lesion with diffuse nonobstructive disease.  First diagonal was small-to-  moderate size with an ostial 50% stenosis.  The ramus was large with mid  long 30% stenosis.  The circumflex in the AV groove was occluded in the  distal circumflex.  This is a very small vessel.  Right coronary artery  was large and dominant with a proximal 25% lesion.  There were mid  luminal irregularities.  PDA was moderate size and normal.  LVEF 65%  with normal wall motion.  1. History of chronic obstructive pulmonary disease.  2. Gastroesophageal reflux disease.  3. Hiatal hernia.  4. Hypertension since 1995.  5. Hyperlipidemia.  6. Ongoing tobacco use.   HOSPITAL COURSE:  A 70 year old Caucasian female who was seen by Dr.  Antoine Poche for evaluation of shortness of breath and lower extremity  edema.  The patient had slowly progressive dyspnea over the years.  The  patient stopped smoking approximately 1 year prior.  The patient had a  nuclear medicine stress Myoview and preliminary perfusion images did not  show any major defect, however, at completion of the exercise the  patient complained of some chest pain with diffuse mild inferior lateral  ST wave abnormalities.  Secondary to this, the patient was admitted for  cardiac catheterization.  Of note, her echocardiogram did reveal some  mild increase aortic valve thickness with elevated peak pulmonary artery  systolic pressure.   The patient did undergo cardiac catheterization per Dr. Rollene Rotunda  on May 10, 2008.  Please see Dr. Jenene Slicker thorough cardiac  catheterization dictation for more details.  The patient tolerated the  procedure well without evidence of bleeding, hematoma, or signs of  infection.  The patient to be treated with aggressive medical  management.   The patient was seen and examined by Dr. Rollene Rotunda on day of  discharge and found to be stable with no recurrence of chest discomfort.  The patient will return home on current medical management and with  close follow up by Dr. Antoine Poche.  The patient's dyspnea will require no  further workup at this time.   DISCHARGE LABORATORIES:  Hemoglobin 11.1, hematocrit 31.9, white blood  cells 6.8, and platelets 187.  Sodium 139, potassium 3.7, chloride 106,  CO2 of 28, glucose 104, BUN 10, and creatinine 1.04.  Cardiac enzymes  were found to be elevated.  Initial troponin 4.90 and 2.83,  respectively.  ABG; pH 7.40, pCO2 of 41.3, pO2 of 65, bicarb 26, pCO2 of  27, and O2 sat 93%.  Discharge EKG revealing sinus bradycardia with  nonspecific ST wave abnormality noted inferior and laterally with  concavity noted in lead I, lead II, and V6.   Echocardiogram dated May 09, 2008, revealing no left ventricular  systolic function abnormalities.  EF was found to be 65% with no wall  motion abnormalities noted.  Estimated peak pulmonary artery systolic  pressure was mildly elevated, this was found to be  36 mmHg.  Chest x-ray  dated May 09, 2008 revealed no acute disease.   DISCHARGE MEDICATIONS:  1. Atenolol 50 mg daily.  2. Hydrochlorothiazide 25 mg daily.  3. Protonix 40 mg daily.  4. Lipitor 20 mg at bedtime.  5. Niacin 500 mg daily.  6. Enteric-coated aspirin 81 mg daily.  7. Centrum Cardio and multivitamin daily.  8. Arthritis pain medication p.r.n.   ALLERGIES:  ASPIRIN, although enteric-coated is tolerated.   FOLLOWUP PLANS AND APPOINTMENT:  1. The patient will follow up with Dr. Rollene Rotunda on May 27, 2008, at 12:00 p.m.  2. The patient will follow up with Dr. Susann Givens on her own accord for      continued medical management.  3. The patient has been given post cardiac catheterization      instructions with particular emphasis on the right groin site for      evidence of bleeding, hematoma, or signs of infection.   TIME SPENT WITH THE PATIENT TO INCLUDE PHYSICIAN TIME:  Forty minutes.      Bettey Mare. Lyman Bishop, NP      Rollene Rotunda, MD, [facility]  Electronically Signed    KML/MEDQ  D:  05/11/2008  T:  05/11/2008  Job:  [id number]   cc:   Sharlot Gowda, M.D.

## 2010-07-03 NOTE — Assessment & Plan Note (Signed)
[facility]                            CARDIOLOGY OFFICE NOTE   NAME:Matthews, [name]                       MRN:          [id number]  DATE:05/27/2008                            DOB:          January 06, 1941    PRIMARY CARE Stephane Matthews:  Jessica Mulders, MD   PRIMARY CARDIOLOGIST:  Jessica Rotunda, MD, [facility]   PATIENT PROFILE:  A 70 year old Caucasian female who was recently  hospitalized secondary to non-ST-elevation MI following abnormal nuclear  study.  1. Coronary artery disease.      a.     May 09, 2008, exercise Myoview, exercise x4 minutes, peak       heart rate of 134 beats per minute.  No major defects shown in       perfusion imaging.  At completion of exercise, the patient       developed chest discomfort with radiation to the arm with mild       inferolateral ST and T-wave abnormalities prompting admission.      b.     Status post non-ST-elevation MI, May 09, 2008.  Cardiac       catheterization, May 10, 2008, revealing normal LV function.  EF       65%.  Left main normal.  LAD diffuse 34% stenosis proximally with       6% lesion in the midsection of the artery.  First diagonal had 50%       stenosis.  Ramus intermedius was large with a long 30% mid       stenosis.  Left circumflex was occluded in the AV groove, and this       was a small vessel.  The right coronary was a large dominant       vessel with proximal 25% tandem lesions.  PDA was normal.  2. Hypertension since 1995.  3. Hyperlipidemia.  4. Remote tobacco abuse, quitting 1 year ago today.  5. Hiatal hernia.  6. GERD.  7. COPD, status post tubal ligation.  8. History of foot surgery.   HISTORY OF PRESENT ILLNESS:  A 70 year old Caucasian female with the  above problem list.  She was admitted on May 13, 2008, following chest  pain and inferolateral ST-segment changes on ECG that occurred after an  exercise Myoview.  The perfusion imaging itself was normal.  Following  admission,  she was subsequently ruled in for non-ST elevation MI and  underwent cardiac catheterization, March 23rd, which revealed an  occluded small AV groove left circumflex and otherwise nonobstructive  disease.  She had normal LV function without regional wall motion  abnormalities.  An echo was also performed during that admission, and  this was normal.  The patient was discharged home up on March 24th, and  since then has not had any recurrent chest pain, dyspnea, or limitations  in activities.  She had no groin complaints today and overall is feeling  well.   ALLERGIES:  Aspirin.   HOME MEDICATIONS:  1. Atenolol and chlorthalidone 50/25 mg daily.  2. Omeprazole 20 mg daily.  3. Calcium 600 mg b.i.d.  4.  Vitamin D3, 2000 units daily.  5. Omega-3, 1200 mg b.i.d.  6. Niacin 500 mg b.i.d.  7. Lipitor 20 mg daily.  8. Centrum Cardio Multivitamin b.i.d.  9. Tylenol arthritis 650 mg p.r.n.  10.Nitroglycerin 0.4 mg sublingual, chest pain.   PHYSICAL EXAMINATION:  VITAL SIGNS:  Blood pressure is 118/68, heart  rate 67, respirations 16.  GENERAL:  She is afebrile, pleasant white female in no acute distress.  Awake, alert, and oriented x3.  PSYCH:  Normal affect.  SKIN:  Warm and dry without lesions or masses.  HEENT:  Normal.  NEURO:  Grossly intact, nonfocal.  MUSCULOSKELETAL:  Grossly normal without deformity or effusion.  NECK:  Supple.  No bruits, JVD.  LUNGS:  Respirations are unlabored.  Clear to auscultation.  CARDIAC:  Regular S1 and S2.  No S3, S4, murmurs.  ABDOMEN:  Obese, soft, nontender, nondistended.  Bowel sounds present.  EXTREMITIES:  Without clubbing, cyanosis, or edema.  The right groin  site, which is used for cardiac catheterization is clear of bleeding,  bruising, hematoma.  Dorsalis pedis, posterior tibial pulses, 2+ and  equal bilaterally.   CLINICAL FINDINGS:  EKG shows sinus rhythm with nonspecific ST-T  abnormality.   ASSESSMENT AND PLAN:  1. Coronary  artery disease.  The patient is status post recent non-ST      elevation myocardial infarction with catheterization revealing      small occluded atrioventricular groove to left circumflex.  The      patient has been doing well since discharge, remains on medical      therapy including beta-blocker and statin therapy.  2. Hypertension, stable.  3. Hyperlipidemia.  Continue statin therapy.  She had normal LFTs      while hospitalized.  4. Tobacco abuse, but the patient quit 1 year ago today of which she      is very proud.  She continues to remain off cigarettes.  5. Gastroesophageal reflux disease.  Continue PPI.   DISPOSITION:  The patient to follow up with Dr. Antoine Matthews in 3 months or  sooner if necessary.      Nicolasa Ducking, ANP  Electronically Signed      Jessica Rotunda, MD, [facility]  Electronically Signed   CB/MedQ  DD: 05/27/2008  DT: 05/28/2008  Job #: [id number]

## 2010-07-03 NOTE — H&P (Signed)
NAMEJAMEISHA, STOFKO NO.:  [id number]   MEDICAL RECORD NO.:  [id number]          PATIENT TYPE:  INP   LOCATION:  3743                         FACILITY:  [facility]   PHYSICIAN:  Arturo Morton. Riley Kill, MD, FACCDATE OF BIRTH:  28-Jun-1940   DATE OF ADMISSION:  05/09/2008  DATE OF DISCHARGE:                              HISTORY & PHYSICAL   CHIEF COMPLAINT:  Jaw, arm, and chest discomfort.   HISTORY OF PRESENT ILLNESS:  Ms. Bartl is a 70 year old female who  presents for evaluation of shortness of breath and lower extremity  edema.  She was evaluated by Dr. Antoine Poche.  She is thought to have  slowly progressive dyspnea over the past few years.  About a year ago,  she stopped smoking.  She was seen in evaluation by Dr. Antoine Poche, and  subsequently set up for radionuclide imaging study.  She exercised for 4  minutes on the Bruce protocol with a peak heart rate of 134 on her 84%  of age predicted maximum.  Her preliminary perfusion images did not show  a major defect.  However, at the completion of the exercise, the patient  then developed some chest discomfort, arm discomfort, and radiation into  the jaw. An hour later, her arm discomfort persists.  It is not severe.  There is some mild inferolateral ST-T wave abnormalities.  Her  echocardiogram revealed mild increased aortic valve thickness.  There is  mild mitral annular calcification, and the peak pulmonary artery  systolic pressure is mildly increased.  As a result, she is in the  office now we have elected to recommend admission to the hospital for  further evaluation.  Specifically, with the findings on radionuclide  imaging, she likely needs catheterization even though the perfusion scan  was normal.   MEDICATIONS:  1. Atenolol/chlorthalidone 50/25 one daily.  2. Omeprazole 20 mg daily.  3. Calcium.  4. Vitamin D.  5. Omega.  6. Niacin 500 mg one by mouth two times daily.  7. Lipitor 20 mg by mouth daily.  8.  Centrum.  9. Tylenol.  10.Arthritis.   ALLERGIES:  ASPIRIN.   PAST MEDICAL HISTORY:  1. COPD.  2. Gastroesophageal reflux.  3. Hiatal hernia.  4. Hypertension since 1995.  5. Hyperlipidemia for years.  6. Smoker.   PAST SURGICAL HISTORY:  Tubal ligation, foot surgery.   REVIEW OF SYSTEMS:  The patient complains of some headaches, otherwise  as stated in the HIP and negative for all other symptoms.   PHYSICAL EXAMINATION:  GENERAL:  She is an alert 70 year old woman in no  acute distress.  NECK:  The jugular veins are not distended.  HEENT:  Unremarkable.  LUNGS:  The lung fields are clear to auscultation and percussion.  CARDIAC:  The rhythm is regular.  There is no murmur, rub, or gallop,  except for a 1/6 systolic ejection murmur, no diastolic murmurs are  appreciated.  She currently has no significant lower extremity edema.   Her EKG is as noted.   IMPRESSION:  1. Abnormal myocardial perfusion imaging study with normal perfusion  images, but onset of chest, arm, and jaw discomfort with exercise      now resolving.  2. Abnormal exercise electrocardiogram with this.  3. Hypertension, on treatment.  4. History of smoking history, quit 1 year ago.  5. Other problems as noted.   PLAN:  Admit to the hospital for further evaluation.  Cardiac  catheterization has been recommended based on the findings risks,  benefits, and alternatives have been discussed in detail.  We will get  cardiac enzymes, and further labs.      Arturo Morton. Riley Kill, MD, [facility]  Electronically Signed     TDS/MEDQ  D:  05/09/2008  T:  05/10/2008  Job:  [id number]   cc:   Sharlot Gowda, M.D.

## 2010-07-06 NOTE — Procedures (Signed)
. [facility]  Patient:    Jessica, Matthews Visit Number: [id number] MRN: [id number]          Service Type: END Location: ENDO Attending Physician:  Nelda Marseille Dictated by:   Petra Kuba, M.D. Proc. Date: 01/26/01 Admit Date:  01/26/2001   CC:         Reuben Likes, M.D.   Procedure Report  PROCEDURE: Colonoscopy with polypectomy.  ENDOSCOPIST: Petra Kuba, M.D.  INDICATIONS FOR PROCEDURE: Patient with multiple polyps on last colonoscopy, although most were hyperplastic want to repeat sooner and remove more just to confirm no significant adenomas.  Consent was signed after risks, benefit, methods, and options were thoroughly discussed multiple times in the past.  MEDICINES USED ADDITIONALLY: Demerol 75 mg, Versed 7.5 mg.  DESCRIPTION OF PROCEDURE: Rectal inspection was pertinent for external hemorrhoids.  Digital examination was negative.  The video pediatric adjustable colonoscope was inserted and easily advanced around the colon to the cecum.  This did require rolling her on her back but no abdominal pressure.  The customary rectal and sigmoid multiple polyps were seen, as was the floppy mid descending polyp that did have some adenomatous tissue on the biopsies.  There was also an occasional scattered on left side including transverse diverticula.  The cecum was identified by the appendiceal orifice and ileocecal valve.  In fact, the scope was inserted a short ways into the terminal ileum, which was normal.  The scope was then slowly withdrawn.  The prep was adequate.  There was some liquid stool that required washing and suctioning.  The cecum, ascending, and majority of the transverse other than an occasional diverticula were normal.  Beginning at the proximal level of the splenic flexure a few smaller polyps were seen and a few were hot biopsied and put in the second container.  We then in the mid descending found the  floppier polyp seen on insertion, which was snared and electrocautery applied, and the polyp was suctioned onto the head of the scope, and the scope was removed and the polyp recovered.  We put that in a third container.  We went ahead and reinserted the scope and on insertion a few snares of rectal and distal sigmoid polyps were done and put in the fourth container.  The larger ones were suctioned onto the head of the scope and the scope was withdrawn.  The others were suctioned through the scope and collected in the trap.  We re-found the floppy polyp in the mid descending and two more snares were done of this.  Both pieces were able to be suctioned through the scope and one hot biopsied.  They were all put in the third container.  An additional descending and proximal sigmoid polyp was snared and put in the second container with the other descending polyps and then we went ahead and did multiple rectal and distal sigmoid snares and put them in the fourth container.  Not all polyps were removed but, again, we did remove polyps for roughly 45 minutes to an hour, the larger ones being suctioned onto the head of the scope, the smaller ones being withdrawn through the suction.  All removed were recovered.  There were no signs of bleeding or obvious complication.  Once back in the rectum the scope was retroflexed revealing some tiny hypoplastic appearing polyps and some small hemorrhoids.  Air was suctioned and the scope removed.  The patient tolerated the procedure well.  There were  no obvious immediate complication.  ENDOSCOPIC DIAGNOSES:  1. Internal and external hemorrhoids.  2. Multiple rectosigmoid polyps with multiple hot biopsies and snares, not     all removed.  3. Descending floppy polyp, status post three snares and one hot biopsy.  4. Occasional diverticula in the sigmoid, descending, and transverse.  5. Otherwise within normal limits to cecum and terminal ileum.  PLAN: Await  pathology to determine future colonic screening.  Will need recheck in at least one year since not all polyps were removed, although most of the bigger and bulkier ones were at least sampled.  Put on the customary two week post polypectomy instructions. Dictated by:   Petra Kuba, M.D. Attending Physician:  Nelda Marseille DD:  01/26/01 TD:  01/26/01 Job: [id number]

## 2010-07-06 NOTE — Discharge Summary (Signed)
[place]. [facility]  Patient:    [name], [name]                       MRN: [id number] Adm. Date:  [id number] Disc. Date: [id number] Attending:  Ronaldo Miyamoto Dictator:   Delton See, P.A. CC:         Reuben Likes, M.D.   Referring Physician Discharge [place]  DATE OF BIRTH:  12-24-1940  HISTORY ON ADMISSION:  This is a 70 year old female with a history of tobacco use, history of hypertension, history of hyperlipidemia who presented to the emergency department from Dr. Ginny Forth office for evaluation.  She had gone in to see Dr. Lorenz Coaster for her annual physical.  She noted some palpitations in the past and mentioned this to Dr. Lorenz Coaster.  An EKG was performed that was felt to be abnormal.  She was sent to [facility] emergency room for further evaluation.  The patient did admit to having occasional chest tightness but she denied any exertional pain.  She also noted palpitations from time to time with occasional presyncope.  She had no frank syncope.  She denied paroxysmal nocturnal dyspnea or orthopnea.  She was admitted for further evaluation.  PAST MEDICAL HISTORY:  Significant for osteoporosis, degenerative joint disease, bilateral feet surgery in 1988.  She is status post bilateral tubal ligation.  ALLERGIES:  ASPIRIN causes stomach upset.  MEDICATIONS PRIOR TO ADMISSION: 1. Hydrochlorothiazide 25 mg daily. 2. Mevacor 20 mg at bedtime. 3. Evista 60 mg daily. 4. Celebrex 200 mg daily.  SOCIAL HISTORY:  This patient smokes one-and-one-half packs of cigarettes per day and has done so for 30 years.  She does not use alcohol.  HOSPITAL COURSE:  As noted, this patient was admitted for further evaluation of chest pain.  She was scheduled for a 2-D echo, a stress Cardiolite, and an outpatient event monitor.  It was also recommended that she quit smoking.  The patient underwent Cardiolite stress testing on July 22, 2000.  Her  stress test was negative for ischemia with an ejection fraction of 67%.  A 2-D echo was also performed.  A preliminary report showed no significant abnormalities. See final report - is pending at time of this dictation.  Arrangements were made to discharge the patient later that evening.  As noted, she will have an outpatient event monitor to further evaluate her palpitations.  LABORATORY DATA:  Cardiac enzymes were negative.  A comprehensive metabolic panel was within normal limits.  A CBC was within normal limits except for a mildly low platelet count of 144,000.  A cholesterol profile revealed cholesterol high at 283, triglycerides 225, HDL 44, LDL 194.  DISCHARGE MEDICATIONS:  The patient was discharged on the same medications she was on prior to admission.  ACTIVITY:  As tolerated.  DIET:  She was told to stay on a low salt/low fat diet.  SPECIAL INSTRUCTIONS:  She was to have an event monitor and the office would call her to schedule this.  FOLLOW-UP:  She would see Dr. Lorenz Coaster as needed.  She would see Dr. Louretta Shorten physician assistant on Monday, July 15 at 10:30 a.m.  PROBLEM LIST AT TIME OF DISCHARGE: 1. Chest pain with negative cardiac enzymes. 2. Exercise Cardiolite performed July 22, 2000 revealing no ischemia, ejection    fraction greater than 60%. 3. Elevated lipids, currently being treated with Mevacor - may need further    therapy. 4. Mildly decreased platelet  count. 5. History of osteoporosis and degenerative joint disease. 6. Status post multiple surgeries. 7. A 2-D echocardiogram currently pending. 8. History of palpitations with outpatient event monitor to be scheduled. DD:  07/22/00 TD:  07/23/00 Job: [id number]

## 2010-07-06 NOTE — Procedures (Signed)
[place]. [facility]  Patient:    [name], [name] Visit Number: [id number] MRN: [id number]          Service Type: END Location: ENDO Attending Physician:  Nelda Marseille Dictated by:   Petra Kuba, M.D. Proc. Date: 07/27/01 Admit Date:  07/27/2001   CC:         Reuben Likes, M.D.   Procedure Report  PROCEDURE PERFORMED:  Colonoscopy with multiple polypectomies.  ENDOSCOPIST:  Petra Kuba, M.D.  INDICATIONS FOR PROCEDURE:  Patient with lots of hyperplastic appearing polyps but also some adenomatous polyps, need to repeat colonoscopy more frequently to try to clear her colon of all adenomatous polyps.  Consent was signed after risks, benefits, methods, and options were thoroughly discussed multiple times in the past.  MEDICATIONS USED:  Demerol 80 mg, Versed 8 mg.  DESCRIPTION OF PROCEDURE:  Rectal inspection was pertinent for external hemorrhoids.  One with a little heme.  Digital exam was negative.  Pediatric video adjustable colonoscope was inserted, easily advanced around the colon to the cecum.  This did require some abdominal pressure but no position changes. The cecum was identified by the appendiceal orifice and the ileocecal valve. Other than the left-sided diverticula and the customary left-sided polyps, no significant abnormality was seen.  The scope was then slowly withdrawn.  In the ascending colon, a tiny polyp was seen and was hot biopsied. Unfortunately no tissue remained in the forceps.  The scope was further withdrawn.  The transverse was normal.  As the scope was withdrawn around the descending.  Some small to tiny hyperplastic appearing polyps were seen, the majority of which were hot biopsied and put in the first container.  This continued to about 30 cm where in the midsigmoid, the polyp size increased and we began snaring multiple polyps in the sigmoid and rectum.  Not all could be snared but most small to moderate  ones were and were all suctioned through the scope and collected in the trap.  None had worrisome appearance.  Back in the rectum, the scope was retroflexed, pertinent for some internal hemorrhoids, one small one was seen on retroflexion which was snared as well.  Although there were some remaining in the left side when we were through, none had worrisome appearance.  They all did seem hyperplastic but that is how they had seen in the past.  We elected to stop the procedure at this junction.  The scope was reinserted a short ways up the sigmoid.  Air was suctioned, scope removed.  The patient tolerated the procedure well.  There was no obvious immediate complication.  ENDOSCOPIC DIAGNOSIS: 1. Internal and external hemorrhoids. 2. Left-sided occasional diverticula. 3. Multiple polyps in the rectosigmoid snared in the sigmoid descending    hot biopsied and one in the descending hot biopsied. 4. Otherwise within normal limits to the cecum except for some residual    polyps on the left side remaining.  PLAN:  Await pathology to determine future colon screening.  If adenomatous continue will probably need to proceed every six months to every year. Dictated by:   Petra Kuba, M.D. Attending Physician:  Nelda Marseille DD:  07/27/01 TD:  07/28/01 Job: [id number]

## 2010-07-06 NOTE — Procedures (Signed)
[place]. [facility]  Patient:    [name], [name] Visit Number: [id number] MRN: [id number]          Service Type: END Location: ENDO Attending Physician:  Nelda Marseille Dictated by:   Petra Kuba, M.D. Proc. Date: 12/25/00 Admit Date:  12/25/2000   CC:         Reuben Likes, M.D.   Procedure Report  PROCEDURE:  Colonoscopy with polypectomy.  INDICATION:  Patient with a family history of colon cancer and breast cancer, probable IBS, due for colonic screening.  Consent was signed after risks, benefits, methods, and options thoroughly discussed in the office.  MEDICATIONS:  Demerol 90 mg, Versed 9 mg.  DESCRIPTION OF PROCEDURE:  Rectal inspection was pertinent for external hemorrhoids.  Digital exam was negative.  The pediatric adjustable video colonoscope was inserted, and immediately seen in the rectum and distal sigmoid were lots of multiple polyps.  There were probably 50 in all; however, once past the sigmoid there was an occasional scattered one throughout the colon but no significant obvious mass lesion or other grouping of a large number.  The scope was inserted to the cecum, requiring some left lower quadrant pressure, no position changes.  Cecum was identified by the appendiceal orifice and the ileocecal valve.  In fact, the scope was inserted a short way into the terminal ileum, which was normal.  The scope was then slowly withdrawn.  The prep was adequate.  There was a fair amount of liquid stool that required washing and suctioning.  The cecum was normal.  The scope was slowly withdrawn.  In the mid-ascending, one 1 cm polyp was seen and snared with electrocautery applied, and the polyp was suctioned through the scope and collected in the trap.  The transverse was fairly normal until we withdrew back to the splenic flexure, where five or six small polyps were seen.  Three of them were snared, electrocautery applied, and were  suctioned through the scope and collected in the trap.  We put the splenic flexure and descending polyps in a separate container.  Possibly on insertion a moderate-sized erythematous polyp in the transverse was seen; however, this was not seen on withdrawal.  We did see a similar-looking polyp to that in the descending, which was thin, floppy, and erythematous, and we went ahead and took a few cold biopsies of that and put it in the same container.  Two other descending polyps were seen, snared, electrocautery applied, and they were suctioned through the scope and collected in the trap.  A few of the tiny to small descending polyps and splenic flexure polyps were left.  One of the polyps in the descending required being suctioned onto the scope and the scope removed to recover the polyp.  We then went ahead and snared approximately 15 of the rectal and distal sigmoid polyps and either suctioned them through the scope or suctioned them onto the head of the scope and removed the scope.  We did retroflex the scope, which revealed internal hemorrhoids.  After multiple polypectomies, the patient became uncomfortable, had difficulty holding air, and we elected to stop the procedure at this juncture.  The rectosigmoid polyps were put in a separate container, and the scope was removed.  The patient tolerated the procedure adequately.  There was no obvious immediate complication.  ENDOSCOPIC DIAGNOSES: 1. Internal-external significant hemorrhoids. 2. Occasional diverticula in the left and midcolon. 3. Questionable 50 rectosigmoid polyps, many were left but about 15  removed. 4. Scattered descending and transverse and ascending polyps, status post    snare, with a few being left in those areas. 5. One descending large, floppy, thin, erythematous polyp, cold biopsied,    possibly a similar-looking one missed in the transverse on withdrawal. 6. Otherwise within normal limits to the terminal  ileum.  PLAN:  Await pathology.  Hold endoscopy for now based on the two-hour procedure to remove these polyps.  Depending on her pathology, might discuss surgical options versus repeating colonoscopy to clear again the descending, transverse, and ascending, and then proceed with surgery on the rectosigmoid. Possibly use diprivan on follow-up, and probably proceed with her endoscopy at that juncture.  Discussed all of the above and the two-week customary postpolypectomy instructions with the patient and her husband, and will probably see them back in the office to review the pathology and decide further workup and plans.  She will call me sooner p.r.n. Dictated by:   Petra Kuba, M.D. Attending Physician:  Nelda Marseille DD:  12/25/00 TD:  12/26/00 Job: [id number]

## 2010-07-06 NOTE — Procedures (Signed)
[place]. [facility]  Patient:    [name], [name] Visit Number: [id number] MRN: [id number]          Service Type: END Location: ENDO Attending Physician:  Nelda Marseille Dictated by:   Petra Kuba, M.D. Proc. Date: 01/26/01 Admit Date:  01/26/2001   CC:         Reuben Likes, M.D.   Procedure Report  PROCEDURE:  Esophagogastroduodenoscopy with biopsy.  INDICATIONS FOR PROCEDURE:  Dysphagia, protractive symptoms.  CONSENT:  Consent was signed after risks, benefits, methods, and options thoroughly discussed in the office.  MEDICATIONS USED: 1. Demerol 50 mg. 2. Versed 7.5 mg.  DESCRIPTION OF PROCEDURE:  The video endoscope was inserted by direct vision. The esophagus was normal.  In the distal esophagus was a moderate hiatal hernia.  I doubted any obvious Barretts was seen, but we went ahead and took a few distal esophageal biopsies just to make sure.  The scope passed into the stomach where some mild gastritis was seen, advanced to a normal pylorus, into the duodenal bulb where some mild bulbitis was seen, and advanced into a normal second portion of the duodenum.  The scope was withdrawn to the bulb, and a good look there confirmed the above findings.  The scope was withdrawn back to the stomach which was evaluated on retroflexion and straight visualization without additional findings with a good look at the cardia, fundus, angularis, lesser and greater curve.  The scope was straightened, and one biopsy of the antrum was obtained to rule out Helicobacter.  Air was suctioned.  The scope was slowly withdrawn.  Again, a good look at the esophagus confirmed above findings.  Distal esophageal biopsies were obtained at this point.  Scope was removed.  The patient tolerated the procedure well. There were no obvious immediate complications.  ENDOSCOPIC DIAGNOSES: 1. Moderate hiatal hernia, status post distal esophageal biopsy to rule out  Barretts. 2. Mild gastritis bulbitis, status post CLO biopsy of the antrum. 3. Otherwise normal esophagogastroduodenoscopy.  PLAN: 1. Trial of pump inhibitors, although she is not sure if she can afford them. 2. Await pathology to make sure no further endoscopic screening is needed. 3. Continue workup with her repeat colonoscopy. Dictated by:   Petra Kuba, M.D. Attending Physician:  Nelda Marseille DD:  01/26/01 TD:  01/26/01 Job: [id number]

## 2010-08-01 ENCOUNTER — Encounter: Payer: Self-pay | Admitting: Family Medicine

## 2010-08-01 ENCOUNTER — Ambulatory Visit (INDEPENDENT_AMBULATORY_CARE_PROVIDER_SITE_OTHER): Payer: No Typology Code available for payment source | Admitting: Family Medicine

## 2010-08-01 VITALS — BP 114/60 | HR 60 | Wt 154.0 lb

## 2010-08-01 DIAGNOSIS — S91109A Unspecified open wound of unspecified toe(s) without damage to nail, initial encounter: Secondary | ICD-10-CM

## 2010-08-01 DIAGNOSIS — S91119A Laceration without foreign body of unspecified toe without damage to nail, initial encounter: Secondary | ICD-10-CM

## 2010-08-01 NOTE — Progress Notes (Signed)
  Subjective:    Patient ID: [name], female    DOB: August 20, 1940, 70 y.o.   MRN: [id number]  HPI She injured her left second toe last night sustaining a small laceration and is here for evaluation.   Review of Systems     Objective:   Physical Exam Exam of the left second toe does show a small laceration with no evidence of erythema warmth or tenderness to the plantar surface.       Assessment & Plan:  Toe laceration. Supportive care. Call if symptoms of pain swelling or discharge.

## 2010-08-01 NOTE — Patient Instructions (Signed)
Keep the area clean and dry and if you see any redness or pain come on back

## 2010-09-07 NOTE — Op Note (Signed)
NAMELUISE, [name] NO.:  [id number]  MEDICAL RECORD NO.:  [id number]           PATIENT TYPE:  LOCATION:                                 FACILITY:  PHYSICIAN:  Cindee Salt, M.D.       DATE OF BIRTH:  Jul 02, 1940  DATE OF PROCEDURE:  04/11/2010 DATE OF DISCHARGE:                              OPERATIVE REPORT   PREOPERATIVE DIAGNOSES: 1. Stenosing tenosynovitis, right middle finger. 2. Cubital tunnel syndrome, right elbow.  POSTOPERATIVE DIAGNOSIS: 1. Stenosing tenosynovitis, right middle finger. 2. Cubital tunnel syndrome, right elbow.  OPERATION:  Release A1 pulley, right middle finger, with decompression of right ulnar nerve at the elbow.  SURGEON:  Cindee Salt, MD  ANESTHESIA:  General.  HISTORY:  The patient is a 70 year old female with a history of numbness and tingling of ring and little fingers with positive hyperflexion test, positive nerve conductions at her elbow for compression of the ulnar nerve.  She also has triggering of her right middle finger which has not responded to conservative treatment.  She has elected to undergo surgical decompression of each with possible transposition to the ulnar nerve at the elbow depending on findings after decompression.  Pre, peri, and postoperative course have been discussed along with risks and complications.  She is aware there is no guarantee with surgery, possibility of infection, recurrence, injury to arteries, nerves, tendons, incomplete relief of symptoms, dystrophy.  In preoperative area, the patient is seen, the extremity was marked by both the patient and surgeon, antibiotic given.  PROCEDURE:  The patient was brought to the operating room where general anesthetic was carried out without difficulty.  She was prepped using ChloraPrep, supine position, right arm free.  A 3-minute dry time was allowed.  Time-out taken confirming the patient and procedure.  The limb was exsanguinated with an  Esmarch bandage.  Tourniquet placed high on the arm was inflated to 150 mmHg.  An oblique incision was made over the A1 pulley of the right middle finger, carried down through subcutaneous tissue.  Bleeders were electrocauterized with bipolar.  Retractor was placed protecting neurovascular structures.  An incision was then made on the radial aspect of the A1 pulley, small incision was made centrally in the A2 pulley, very significant tenosynovitis was present proximally. This was debrided, released proximally including the palmar pulley.  The finger placed through full range motion, no further triggering was noted.  The wound was irrigated.  Skin closed with interrupted 4-0 Vicryl Rapide sutures.  Separate incision was then made over the medial epicondyle of the elbow, approximately 1-1.5 inches in length, carried down through subcutaneous tissue.  Bleeders were again electrocauterized with bipolar, dissection carried down to [employer] fascia.  This was released in its posterior aspect.  This allowed visualization of the ulnar nerve.  The fascia was then dissected free from the flexor carpi ulnaris muscle.  A fasciotomy performed after placement of two knee retractors.  The muscle was then split down to anterior-posterior R muscle bellies. The deep fascia was then released for approximately 6 cm proximal to the elbow epicondyle after placement of a  KMI carpal tunnel release skid.  This was done with an angled ENT suture scissors.  The instruments were removed.  These were then directed proximally.  Again the fascia was dissected free from the posterior deep fascia.  Bleeders were electrocauterized.  Two knee retractors were placed, the KMI skid was then inserted between the nerve and the fascia, and the fascia was released proximally for approximately 6 cm.  The elbow was placed through full flexion.  No subluxation to the nerve was noted.  The wound was copiously irrigated with saline.  The  anterior aspect of Osborne fascia was then sutured to the posterior skin flap with figure-of-eight 4-0 Vicryl sutures.  Subcutaneous tissue was closed with interrupted 4-0 Vicryl and skin with a subcuticular 4-0 Vicryl Rapide suture.  A local infiltration to each wound was given with 0.25% Marcaine without epinephrine, approximately 6 mL was used.  Sterile compressive dressing, long-arm splint to the elbow flexed approximately 30 degrees was applied.  On deflation of the tourniquet, all fingers immediately pinked.  She was taken to the recovery room for observation in satisfactory condition.  She will be discharged home to return to [facility] [place] in 1 week on Vicodin.          ______________________________ Cindee Salt, M.D.     GK/MEDQ  D:  04/11/2010  T:  04/11/2010  Job:  [id number]  Electronically Signed by Cindee Salt M.D. on 09/07/2010 08:58:40 AM

## 2010-09-11 ENCOUNTER — Telehealth: Payer: Self-pay | Admitting: Family Medicine

## 2010-09-12 NOTE — Telephone Encounter (Signed)
PATIENT PICKED UP WRITTEN SCRIPT TODAY

## 2010-09-13 ENCOUNTER — Other Ambulatory Visit: Payer: Self-pay

## 2010-09-13 MED ORDER — ATORVASTATIN CALCIUM 20 MG PO TABS: 20.0000 mg | ORAL_TABLET | Freq: Every day | ORAL | Status: DC

## 2010-09-18 ENCOUNTER — Other Ambulatory Visit: Payer: Self-pay | Admitting: Family Medicine

## 2010-09-18 MED ORDER — ALBUTEROL SULFATE HFA 108 (90 BASE) MCG/ACT IN AERS: 2.0000 | INHALATION_SPRAY | RESPIRATORY_TRACT | Status: AC | PRN

## 2010-10-03 ENCOUNTER — Ambulatory Visit: Payer: Medicare Other | Attending: Sports Medicine | Admitting: Physical Therapy

## 2010-10-03 DIAGNOSIS — M25519 Pain in unspecified shoulder: Secondary | ICD-10-CM | POA: Insufficient documentation

## 2010-10-03 DIAGNOSIS — IMO0001 Reserved for inherently not codable concepts without codable children: Secondary | ICD-10-CM | POA: Insufficient documentation

## 2010-10-03 DIAGNOSIS — M25619 Stiffness of unspecified shoulder, not elsewhere classified: Secondary | ICD-10-CM | POA: Insufficient documentation

## 2010-10-08 ENCOUNTER — Ambulatory Visit: Payer: Medicare Other | Admitting: Physical Therapy

## 2010-10-11 ENCOUNTER — Ambulatory Visit: Payer: Medicare Other | Admitting: Physical Therapy

## 2010-10-16 ENCOUNTER — Ambulatory Visit: Payer: Medicare Other | Admitting: Physical Therapy

## 2010-10-17 ENCOUNTER — Telehealth: Payer: Self-pay | Admitting: Family Medicine

## 2010-10-17 NOTE — Telephone Encounter (Signed)
Pt needs ov she is coming in 09/4

## 2010-10-18 ENCOUNTER — Ambulatory Visit: Payer: Medicare Other | Admitting: Rehabilitation

## 2010-10-19 ENCOUNTER — Encounter: Payer: Self-pay | Admitting: Family Medicine

## 2010-10-23 ENCOUNTER — Ambulatory Visit (INDEPENDENT_AMBULATORY_CARE_PROVIDER_SITE_OTHER): Payer: No Typology Code available for payment source | Admitting: Family Medicine

## 2010-10-23 ENCOUNTER — Encounter: Payer: Self-pay | Admitting: Family Medicine

## 2010-10-23 VITALS — BP 112/60 | HR 58 | Wt 150.0 lb

## 2010-10-23 DIAGNOSIS — Z79899 Other long term (current) drug therapy: Secondary | ICD-10-CM

## 2010-10-23 DIAGNOSIS — N318 Other neuromuscular dysfunction of bladder: Secondary | ICD-10-CM

## 2010-10-23 DIAGNOSIS — Z0000 Encounter for general adult medical examination without abnormal findings: Secondary | ICD-10-CM

## 2010-10-23 DIAGNOSIS — E785 Hyperlipidemia, unspecified: Secondary | ICD-10-CM

## 2010-10-23 DIAGNOSIS — N3281 Overactive bladder: Secondary | ICD-10-CM

## 2010-10-23 DIAGNOSIS — Z23 Encounter for immunization: Secondary | ICD-10-CM

## 2010-10-23 DIAGNOSIS — I10 Essential (primary) hypertension: Secondary | ICD-10-CM

## 2010-10-23 MED ORDER — ATORVASTATIN CALCIUM 20 MG PO TABS: 20.0000 mg | ORAL_TABLET | Freq: Every day | ORAL | Status: AC

## 2010-10-23 NOTE — Progress Notes (Signed)
  Subjective:    Patient ID: [name], female    DOB: 1940-12-30, 70 y.o.   MRN: [id number]  HPI She is here for a medication recheck. She has lost over 50 pounds over the last year and a half. This was mainly due to taking care of her husband who died within the last year. She seems to be doing fairly well in regard to this. She continues on medications listed in the chart and is having no difficulty with this. She does have some cracking of her right heel that she would like me to look at. She continues to have urinary symptoms and apparently will need to have surgery in the near future.   Review of Systems Negative except as above    Objective:   Physical Exam alert and in no distress. Tympanic membranes and canals are normal. Throat is clear. Tonsils are normal. Neck is supple without adenopathy or thyromegaly. Cardiac exam shows a regular sinus rhythm without murmurs or gallops. Lungs are clear to auscultation. Exam of the right heel does show slight cracking medially.       Assessment & Plan:  Hyperlipidemia. Obesity. Hypertension. Right heel lesion. O AB Recommend cortisone cream for the heel. Routine blood screening. Encouraged her to continue with her diet and exercise.

## 2010-10-24 ENCOUNTER — Ambulatory Visit: Payer: Medicare Other | Attending: Sports Medicine | Admitting: Physical Therapy

## 2010-10-24 DIAGNOSIS — IMO0001 Reserved for inherently not codable concepts without codable children: Secondary | ICD-10-CM | POA: Insufficient documentation

## 2010-10-24 DIAGNOSIS — M25519 Pain in unspecified shoulder: Secondary | ICD-10-CM | POA: Insufficient documentation

## 2010-10-24 DIAGNOSIS — M25619 Stiffness of unspecified shoulder, not elsewhere classified: Secondary | ICD-10-CM | POA: Insufficient documentation

## 2010-10-24 LAB — COMPREHENSIVE METABOLIC PANEL
Albumin: 4.6 g/dL (ref 3.5–5.2)
CO2: 27 mEq/L (ref 19–32)
Calcium: 10.6 mg/dL — ABNORMAL HIGH (ref 8.4–10.5)
Chloride: 98 mEq/L (ref 96–112)
Glucose, Bld: 85 mg/dL (ref 70–99)
Potassium: 4.2 mEq/L (ref 3.5–5.3)
Sodium: 135 mEq/L (ref 135–145)
Total Protein: 6.8 g/dL (ref 6.0–8.3)

## 2010-10-24 LAB — CBC WITH DIFFERENTIAL/PLATELET
HCT: 39.4 % (ref 36.0–46.0)
Hemoglobin: 13.9 g/dL (ref 12.0–15.0)
Lymphs Abs: 1.7 10*3/uL (ref 0.7–4.0)
Monocytes Absolute: 0.5 10*3/uL (ref 0.1–1.0)
Monocytes Relative: 9 % (ref 3–12)
Neutro Abs: 3.1 10*3/uL (ref 1.7–7.7)
Neutrophils Relative %: 58 % (ref 43–77)
RBC: 4.41 MIL/uL (ref 3.87–5.11)

## 2010-10-24 LAB — LIPID PANEL
Cholesterol: 168 mg/dL (ref 0–200)
LDL Cholesterol: 83 mg/dL (ref 0–99)
Triglycerides: 134 mg/dL (ref <150)

## 2010-10-25 ENCOUNTER — Telehealth: Payer: Self-pay

## 2010-10-25 NOTE — Telephone Encounter (Signed)
Pt informed of labs

## 2010-10-26 ENCOUNTER — Ambulatory Visit: Payer: Medicare Other | Admitting: Physical Therapy

## 2010-10-30 ENCOUNTER — Ambulatory Visit: Payer: Medicare Other | Admitting: Physical Therapy

## 2010-11-01 ENCOUNTER — Ambulatory Visit: Payer: Medicare Other | Admitting: Physical Therapy

## 2010-11-06 ENCOUNTER — Ambulatory Visit: Payer: Medicare Other | Admitting: Physical Therapy

## 2010-11-06 ENCOUNTER — Encounter: Payer: Medicare Other | Admitting: Physical Therapy

## 2010-11-09 ENCOUNTER — Ambulatory Visit: Payer: Medicare Other | Admitting: Physical Therapy

## 2010-11-14 ENCOUNTER — Ambulatory Visit: Payer: Medicare Other | Admitting: Rehabilitation

## 2010-11-16 ENCOUNTER — Ambulatory Visit: Payer: Medicare Other | Admitting: Physical Therapy

## 2010-11-20 ENCOUNTER — Ambulatory Visit: Payer: Medicare Other | Admitting: Cardiology

## 2010-12-07 ENCOUNTER — Other Ambulatory Visit: Payer: Self-pay | Admitting: Gastroenterology

## 2010-12-10 ENCOUNTER — Ambulatory Visit: Payer: Medicare Other | Attending: Sports Medicine | Admitting: Physical Therapy

## 2010-12-10 DIAGNOSIS — M25619 Stiffness of unspecified shoulder, not elsewhere classified: Secondary | ICD-10-CM | POA: Insufficient documentation

## 2010-12-10 DIAGNOSIS — IMO0001 Reserved for inherently not codable concepts without codable children: Secondary | ICD-10-CM | POA: Insufficient documentation

## 2010-12-10 DIAGNOSIS — M25519 Pain in unspecified shoulder: Secondary | ICD-10-CM | POA: Insufficient documentation

## 2010-12-13 ENCOUNTER — Ambulatory Visit (INDEPENDENT_AMBULATORY_CARE_PROVIDER_SITE_OTHER): Payer: No Typology Code available for payment source | Admitting: Cardiology

## 2010-12-13 ENCOUNTER — Encounter: Payer: Self-pay | Admitting: Cardiology

## 2010-12-13 DIAGNOSIS — I10 Essential (primary) hypertension: Secondary | ICD-10-CM

## 2010-12-13 DIAGNOSIS — E663 Overweight: Secondary | ICD-10-CM

## 2010-12-13 DIAGNOSIS — E7800 Pure hypercholesterolemia, unspecified: Secondary | ICD-10-CM

## 2010-12-13 DIAGNOSIS — I359 Nonrheumatic aortic valve disorder, unspecified: Secondary | ICD-10-CM

## 2010-12-13 DIAGNOSIS — I2510 Atherosclerotic heart disease of native coronary artery without angina pectoris: Secondary | ICD-10-CM

## 2010-12-13 DIAGNOSIS — F17200 Nicotine dependence, unspecified, uncomplicated: Secondary | ICD-10-CM

## 2010-12-13 NOTE — Assessment & Plan Note (Signed)
The patient has no new sypmtoms.  No further cardiovascular testing is indicated.  We will continue with aggressive risk reduction and meds as listed.  

## 2010-12-13 NOTE — Assessment & Plan Note (Signed)
We discussed a specific strategy for tobacco cessation.  (Greater than three minutes discussing tobacco cessation.)  She will restart Zyban when she is out of the donut hole.

## 2010-12-13 NOTE — Assessment & Plan Note (Signed)
Lab Results  Component Value Date   LDLCALC 83 10/23/2010  HDL was 58.  She will remain on current meds.

## 2010-12-13 NOTE — Patient Instructions (Signed)
The current medical regimen is effective;  continue present plan and medications.  Follow up in 1 year with Dr Hochrein.  You will receive a letter in the mail 2 months before you are due.  Please call us when you receive this letter to schedule your follow up appointment.  

## 2010-12-13 NOTE — Assessment & Plan Note (Signed)
The blood pressure is at target. No change in medications is indicated. We will continue with therapeutic lifestyle changes (TLC).  

## 2010-12-13 NOTE — Progress Notes (Signed)
HPI The patient presents for evaluation of CAD.  Since I last saw her she has done relatively well.  She does have DOE but this has been progressive.  Unfortunately she restarted smoking cigarettes. The patient denies any new symptoms such as chest discomfort, neck or arm discomfort. There has been no new PND or orthopnea. There have been no reported palpitations, presyncope or syncope.  She walks the dog and does yard work.    Allergies  Allergen Reactions  . Aspirin     Current Outpatient Prescriptions  Medication Sig Dispense Refill  . acetaminophen (TYLENOL) 650 MG CR tablet Take 650 mg by mouth as needed.        Marland Kitchen albuterol (PROAIR HFA) 108 (90 BASE) MCG/ACT inhaler Inhale 2 puffs into the lungs every 4 (four) hours as needed.  1 Inhaler  1  . aspirin 81 MG tablet Take 81 mg by mouth daily.        Marland Kitchen atenolol-chlorthalidone (TENORETIC) 50-25 MG per tablet Take 1 tablet by mouth daily.        Marland Kitchen atorvastatin (LIPITOR) 20 MG tablet Take 1 tablet (20 mg total) by mouth daily.  90 tablet  3  . calcium-vitamin D (OSCAL WITH D) 500-200 MG-UNIT per tablet Take 1 tablet by mouth daily.        . Coenzyme Q10 (CO Q 10) 100 MG CAPS Take by mouth daily.        . Diphenhydramine-APAP, sleep, (ACETAMINOPHEN PM PO) Take 2 tablets by mouth as needed.        . fish oil-omega-3 fatty acids 1000 MG capsule Take 2 g by mouth daily.        . folic acid (FOLVITE) 800 MCG tablet Take 400 mcg by mouth daily.        . niacin 500 MG tablet Take 500 mg by mouth 2 (two) times daily with a meal.       . nitroGLYCERIN (NITROSTAT) 0.4 MG SL tablet Place 0.4 mg under the tongue every 5 (five) minutes as needed.        Marland Kitchen omeprazole (PRILOSEC) 20 MG capsule Take 20 mg by mouth daily.        . potassium chloride SA (K-DUR,KLOR-CON) 20 MEQ tablet Take 20 mEq by mouth daily.        . predniSONE (DELTASONE) 5 MG tablet as directed.       . risperiDONE (RISPERDAL) 1 MG tablet Take 0.5 mg by mouth daily.       . solifenacin  (VESICARE) 10 MG tablet Take 5 mg by mouth daily.        Marland Kitchen buPROPion (WELLBUTRIN XL) 150 MG 24 hr tablet Take 150 mg by mouth every morning.         Past Medical History  Diagnosis Date  . Arthritis   . Hypertension   . Dyslipidemia   . HH (hiatus hernia)   . Hemorrhoids   . Osteoporosis     OSTEOPENIA  . Smoker     QUIT IN 05/2007, Restarted  . ASHD (arteriosclerotic heart disease)     Distal circ occlusion 2010, nonobstructive disease elsewhere  . Incontinence     Past Surgical History  Procedure Date  . Tubal ligation   . Foot surgery   . Toe surgery   . Trigger finger release     ROS:  As stated in the HPI and negative for all other systems.  PHYSICAL EXAM BP 122/78  Pulse 59  Ht 5\' 3"  (  1.6 m)  Wt 148 lb (67.132 kg)  BMI 26.22 kg/m2 GENERAL:  Well appearing HEENT:  Pupils equal round and reactive, fundi not visualized, oral mucosa unremarkable NECK:  No jugular venous distention, waveform within normal limits, carotid upstroke brisk and symmetric, no bruits, no thyromegaly LYMPHATICS:  No cervical, inguinal adenopathy LUNGS:  Clear to auscultation bilaterally BACK:  No CVA tenderness CHEST:  Unremarkable HEART:  PMI not displaced or sustained,S1 and S2 within normal limits, no S3, no S4, no clicks, no rubs, no murmurs ABD:  Flat, positive bowel sounds normal in frequency in pitch, no bruits, no rebound, no guarding, no midline pulsatile mass, no hepatomegaly, no splenomegaly EXT:  2 plus pulses throughout, no edema, no cyanosis no clubbing SKIN:  No rashes no nodules NEURO:  Cranial nerves II through XII grossly intact, motor grossly intact throughout [facility]:  Cognitively intact, oriented to person place and time   EKG:  12/13/2010  Sinus rhythm, rate 59, axis within normal limits, intervals within normal limits, non specific ST-T wave changes.   ASSESSMENT AND PLAN

## 2010-12-14 ENCOUNTER — Ambulatory Visit: Payer: Medicare Other | Admitting: Physical Therapy

## 2010-12-18 ENCOUNTER — Ambulatory Visit: Payer: Medicare Other | Admitting: Physical Therapy

## 2010-12-18 ENCOUNTER — Ambulatory Visit (INDEPENDENT_AMBULATORY_CARE_PROVIDER_SITE_OTHER): Payer: No Typology Code available for payment source | Admitting: Family Medicine

## 2010-12-18 VITALS — BP 132/84 | HR 62 | Temp 97.6°F | Wt 151.0 lb

## 2010-12-18 DIAGNOSIS — M7551 Bursitis of right shoulder: Secondary | ICD-10-CM

## 2010-12-18 DIAGNOSIS — M67919 Unspecified disorder of synovium and tendon, unspecified shoulder: Secondary | ICD-10-CM

## 2010-12-18 DIAGNOSIS — G4730 Sleep apnea, unspecified: Secondary | ICD-10-CM

## 2010-12-18 DIAGNOSIS — J019 Acute sinusitis, unspecified: Secondary | ICD-10-CM

## 2010-12-18 MED ORDER — AMOXICILLIN 875 MG PO TABS: 875.0000 mg | ORAL_TABLET | Freq: Two times a day (BID) | ORAL | Status: AC

## 2010-12-18 NOTE — Progress Notes (Signed)
  Subjective:    Patient ID: [name], female    DOB: [birth year], 70 y.o.   MRN: [id number]  HPI Approximately one week ago she started having difficulty with headache,rhinorea, intermittent dizziness, PND. She is now on a CPAP machine. Continues to smoke. Presently she is on prednisone as well as Indocin for treatment of the shoulder bursitis/tendinitis. She does continue to smoke. She has used Wellbutrin for this in the past and apparently is still on this although the cost is apparently prohibited since she is now on the donut hole.   Review of Systems     Objective:   Physical Exam alert and in no distress. Tympanic membranes and canals are normal. Throat is clear. Tonsils are normal. Neck is supple without adenopathy or thyromegaly. Cardiac exam shows a regular sinus rhythm without murmurs or gallops. Lungs are clear to auscultation.        Assessment & Plan:  Sinusitis.Sleep apnea. Right shoulder bursitis/tendinitis I will place her on Amoxil. Recommend she call me if not entirely better.

## 2010-12-18 NOTE — Patient Instructions (Addendum)
If you have sinus congestion use Afrin nasal spray especially for the flight to [place]. Take all the antibiotic and let me know how you are doing. Check with her psychiatrist on getting generic bupropion

## 2010-12-20 ENCOUNTER — Telehealth: Payer: Self-pay | Admitting: Family Medicine

## 2010-12-20 ENCOUNTER — Ambulatory Visit: Payer: Medicare Other | Attending: Sports Medicine | Admitting: Physical Therapy

## 2010-12-20 DIAGNOSIS — M25519 Pain in unspecified shoulder: Secondary | ICD-10-CM | POA: Insufficient documentation

## 2010-12-20 DIAGNOSIS — M25619 Stiffness of unspecified shoulder, not elsewhere classified: Secondary | ICD-10-CM | POA: Insufficient documentation

## 2010-12-20 DIAGNOSIS — IMO0001 Reserved for inherently not codable concepts without codable children: Secondary | ICD-10-CM | POA: Insufficient documentation

## 2010-12-20 NOTE — Telephone Encounter (Signed)
Will try over the counter medicine that she got at [facility] and try that for a while and if that doesn't work she will call back to try something else

## 2010-12-20 NOTE — Telephone Encounter (Signed)
Have her try the over-the-counter motion sickness medication(Bonine) which is the same as the one we can call in.

## 2010-12-21 ENCOUNTER — Ambulatory Visit: Payer: Medicare Other | Admitting: Cardiology

## 2010-12-25 ENCOUNTER — Ambulatory Visit: Payer: Medicare Other | Admitting: Physical Therapy

## 2011-04-17 ENCOUNTER — Telehealth: Payer: Self-pay | Admitting: Family Medicine

## 2011-04-17 NOTE — Telephone Encounter (Signed)
DONE

## 2011-09-25 ENCOUNTER — Other Ambulatory Visit (HOSPITAL_COMMUNITY)
Admission: RE | Admit: 2011-09-25 | Discharge: 2011-09-25 | Disposition: A | Discharge status: Home / Self Care | Payer: Medicare Other | Source: Ambulatory Visit | Attending: Obstetrics and Gynecology | Admitting: Obstetrics and Gynecology

## 2011-09-25 ENCOUNTER — Other Ambulatory Visit: Payer: Self-pay | Admitting: Obstetrics and Gynecology

## 2011-09-25 DIAGNOSIS — Z124 Encounter for screening for malignant neoplasm of cervix: Secondary | ICD-10-CM | POA: Insufficient documentation

## 2011-10-21 ENCOUNTER — Emergency Department (INDEPENDENT_AMBULATORY_CARE_PROVIDER_SITE_OTHER)
Admission: EM | Admit: 2011-10-21 | Discharge: 2011-10-21 | Disposition: A | Discharge status: Home / Self Care | Payer: Medicare Other | Source: Home / Self Care | Attending: Family Medicine | Admitting: Family Medicine

## 2011-10-21 ENCOUNTER — Encounter (HOSPITAL_COMMUNITY): Payer: Self-pay

## 2011-10-21 DIAGNOSIS — L03031 Cellulitis of right toe: Secondary | ICD-10-CM

## 2011-10-21 DIAGNOSIS — L03039 Cellulitis of unspecified toe: Secondary | ICD-10-CM

## 2011-10-21 MED ORDER — CEPHALEXIN 500 MG PO CAPS: 500.0000 mg | ORAL_CAPSULE | Freq: Three times a day (TID) | ORAL | Status: AC

## 2011-10-21 MED ORDER — TERBINAFINE HCL 1 % EX CREA: TOPICAL_CREAM | Freq: Two times a day (BID) | CUTANEOUS | Status: AC

## 2011-10-21 NOTE — ED Notes (Signed)
History of problem w skin between 4th/5th toes of right foot, off and on for several years; problem has reoccurred, and unable to get under control at home , in spite of using usual methods ; foot reddened, skin open, excoriated; Dr Artis Flock in to see pt, and performed  minimal skin debridement; p laced in antiseptic soak

## 2011-10-21 NOTE — ED Provider Notes (Signed)
History     CSN: [id number]  Arrival date & time 10/21/11  1016   First MD Initiated Contact with Patient 10/21/11 1018      Chief Complaint  Patient presents with  . Wound Infection    (Consider location/radiation/quality/duration/timing/severity/associated sxs/prior treatment) Patient is a 71 y.o. female presenting with lower extremity pain. The history is provided by the patient.  Foot Pain This is a new problem. The current episode started more than 1 week ago. The problem has been gradually worsening.    Past Medical History  Diagnosis Date  . Arthritis   . Hypertension   . Dyslipidemia   . HH (hiatus hernia)   . Hemorrhoids   . Osteoporosis     OSTEOPENIA  . Smoker     QUIT IN 05/2007, Restarted  . ASHD (arteriosclerotic heart disease)     Distal circ occlusion 2010, nonobstructive disease elsewhere  . Incontinence     Past Surgical History  Procedure Date  . Tubal ligation   . Foot surgery   . Toe surgery   . Trigger finger release     History reviewed. No pertinent family history.  History  Substance Use Topics  . Smoking status: Current Everyday Smoker -- 1.0 packs/day    Types: Cigarettes  . Smokeless tobacco: Never Used  . Alcohol Use: Not on file    OB History    Grav Para Term Preterm Abortions TAB SAB Ect Mult Living                  Review of Systems  Constitutional: Negative.   Skin: Positive for wound.    Allergies  Review of patient's allergies indicates no active allergies.  Home Medications   Current Outpatient Rx  Name Route Sig Dispense Refill  . ACETAMINOPHEN ER 650 MG PO TBCR Oral Take 650 mg by mouth as needed.      . ASPIRIN 81 MG PO TABS Oral Take 81 mg by mouth daily.      . ATENOLOL-CHLORTHALIDONE 50-25 MG PO TABS Oral Take 1 tablet by mouth daily.      . ATORVASTATIN CALCIUM 20 MG PO TABS Oral Take 1 tablet (20 mg total) by mouth daily. 90 tablet 3  . BUPROPION HCL ER (XL) 150 MG PO TB24 Oral Take 150 mg by mouth  every morning.     Marland Kitchen CALCIUM CARBONATE-VITAMIN D 500-200 MG-UNIT PO TABS Oral Take 1 tablet by mouth daily.      Marland Kitchen FOLIC ACID 800 MCG PO TABS Oral Take 400 mcg by mouth daily.      . INDOMETHACIN 25 MG PO CAPS Oral Take 25 mg by mouth 2 (two) times daily with a meal.      . OMEPRAZOLE 20 MG PO CPDR Oral Take 20 mg by mouth daily.      Marland Kitchen POTASSIUM CHLORIDE CRYS ER 20 MEQ PO TBCR Oral Take 20 mEq by mouth daily.      Marland Kitchen PREDNISONE 5 MG PO TABS  as directed.     Marland Kitchen RISPERIDONE 1 MG PO TABS Oral Take 0.5 mg by mouth daily.     Marland Kitchen SOLIFENACIN SUCCINATE 10 MG PO TABS Oral Take 5 mg by mouth daily.      . ALBUTEROL SULFATE HFA 108 (90 BASE) MCG/ACT IN AERS Inhalation Inhale 2 puffs into the lungs every 4 (four) hours as needed. 1 Inhaler 1  . CEPHALEXIN 500 MG PO CAPS Oral Take 1 capsule (500 mg total) by mouth 3 (  three) times daily. Take all of medicine and drink lots of fluids 21 capsule 0  . CO Q 10 100 MG PO CAPS Oral Take by mouth daily.      . ACETAMINOPHEN PM PO Oral Take 2 tablets by mouth as needed.      . OMEGA-3 FATTY ACIDS 1000 MG PO CAPS Oral Take 2 g by mouth daily.      Marland Kitchen NIACIN 500 MG PO TABS Oral Take 500 mg by mouth 2 (two) times daily with a meal.     . NITROGLYCERIN 0.4 MG SL SUBL Sublingual Place 0.4 mg under the tongue every 5 (five) minutes as needed.      . TERBINAFINE HCL 1 % EX CREA Topical Apply topically 2 (two) times daily. 30 g 0    BP 138/67  Pulse 63  Temp 98.4 F (36.9 C) (Oral)  Resp 16  SpO2 100%  Physical Exam  Nursing note and vitals reviewed. Constitutional: She appears well-developed and well-nourished.  Skin: Skin is warm and dry.       Skin ulceration in web space between 4th and 5th toes on right foot with foul smell and erythema to dorsum of foot. Skin debrided, betadine soaked.    ED Course  Procedures (including critical care time)  Labs Reviewed - No data to display No results found.   1. Cellulitis of fourth toe of right foot       MDM   Foot skin debrided, betadine soaked,         Linna Hoff, MD 10/21/11 1104

## 2011-10-22 ENCOUNTER — Telehealth: Payer: Self-pay | Admitting: Family Medicine

## 2011-10-22 NOTE — Telephone Encounter (Signed)
LM

## 2011-11-11 DIAGNOSIS — R41841 Cognitive communication deficit: Secondary | ICD-10-CM

## 2011-11-13 DIAGNOSIS — R41841 Cognitive communication deficit: Secondary | ICD-10-CM

## 2011-12-26 ENCOUNTER — Encounter: Payer: Self-pay | Admitting: Cardiology

## 2011-12-26 ENCOUNTER — Ambulatory Visit (INDEPENDENT_AMBULATORY_CARE_PROVIDER_SITE_OTHER): Payer: Medicare Other | Admitting: Cardiology

## 2011-12-26 VITALS — BP 119/71 | HR 61 | Ht 63.0 in | Wt 165.8 lb

## 2011-12-26 DIAGNOSIS — E663 Overweight: Secondary | ICD-10-CM

## 2011-12-26 DIAGNOSIS — E669 Obesity, unspecified: Secondary | ICD-10-CM

## 2011-12-26 DIAGNOSIS — F17200 Nicotine dependence, unspecified, uncomplicated: Secondary | ICD-10-CM

## 2011-12-26 DIAGNOSIS — R0602 Shortness of breath: Secondary | ICD-10-CM

## 2011-12-26 DIAGNOSIS — I2510 Atherosclerotic heart disease of native coronary artery without angina pectoris: Secondary | ICD-10-CM

## 2011-12-26 DIAGNOSIS — E785 Hyperlipidemia, unspecified: Secondary | ICD-10-CM

## 2011-12-26 DIAGNOSIS — I359 Nonrheumatic aortic valve disorder, unspecified: Secondary | ICD-10-CM

## 2011-12-26 NOTE — Patient Instructions (Addendum)
The current medical regimen is effective;  continue present plan and medications.  Your physician has requested that you have a lexiscan myoview. For further information please visit [email or web address]. Please follow instruction sheet, as given.   

## 2011-12-26 NOTE — Progress Notes (Signed)
HPI The patient presents for evaluation of CAD.  Since I last saw her she did have some chest discomfort a couple of weeks ago. This lasted for about 1 week. This seemed to happen more in the evening. He would describe a discomfort across her chest and not into her neck or arms. She didn't think this is like previous reflux or necessarily like her non-Q-wave myocardial infarction. She would take TUMS and had some relief. She hasn't had this in the last several days. She does not get this with activity though she has a decreased functional status. She has a soft boot on her right foot  She walks the dog.  With this she has noticed some increased dyspnea particularly when she starts to walk. She's not describing any PND or orthopnea. She's not describing palpitations, presyncope or syncope. Of note she did stop smoking.   No Active Allergies  Current Outpatient Prescriptions  Medication Sig Dispense Refill  . acetaminophen (TYLENOL) 650 MG CR tablet Take 650 mg by mouth as needed.        . predniSONE (DELTASONE) 5 MG tablet as directed.       . risperiDONE (RISPERDAL) 1 MG tablet Take 0.5 mg by mouth daily.       . solifenacin (VESICARE) 10 MG tablet Take 5 mg by mouth daily.        Marland Kitchen sulfamethoxazole-trimethoprim (BACTRIM DS) 800-160 MG per tablet       . terbinafine (LAMISIL) 1 % cream Apply topically 2 (two) times daily.  30 g  0  . albuterol (PROAIR HFA) 108 (90 BASE) MCG/ACT inhaler Inhale 2 puffs into the lungs every 4 (four) hours as needed.  1 Inhaler  1  . aspirin 81 MG tablet Take 81 mg by mouth daily.        Marland Kitchen atenolol-chlorthalidone (TENORETIC) 50-25 MG per tablet Take 1 tablet by mouth daily.        Marland Kitchen atorvastatin (LIPITOR) 20 MG tablet Take 1 tablet (20 mg total) by mouth daily.  90 tablet  3  . buPROPion (WELLBUTRIN XL) 150 MG 24 hr tablet Take 450 mg by mouth every morning.       . calcium-vitamin D (OSCAL WITH D) 500-200 MG-UNIT per tablet Take 1 tablet by mouth daily.        .  cephALEXin (KEFLEX) 500 MG capsule       . Coenzyme Q10 (CO Q 10) 100 MG CAPS Take by mouth daily.        . Diphenhydramine-APAP, sleep, (ACETAMINOPHEN PM PO) Take 2 tablets by mouth as needed.        . fish oil-omega-3 fatty acids 1000 MG capsule Take 2 g by mouth daily.        . folic acid (FOLVITE) 800 MCG tablet Take 400 mcg by mouth daily.        . hydrOXYzine (VISTARIL) 25 MG capsule       . indomethacin (INDOCIN) 25 MG capsule Take 25 mg by mouth 2 (two) times daily with a meal.        . niacin 500 MG tablet Take 500 mg by mouth 2 (two) times daily with a meal.       . nitroGLYCERIN (NITROSTAT) 0.4 MG SL tablet Place 0.4 mg under the tongue every 5 (five) minutes as needed.        Marland Kitchen omeprazole (PRILOSEC) 20 MG capsule Take 20 mg by mouth daily.        Marland Kitchen  oxyCODONE-acetaminophen (PERCOCET) 7.5-325 MG per tablet       . potassium chloride SA (K-DUR,KLOR-CON) 20 MEQ tablet Take 20 mEq by mouth daily.          Past Medical History  Diagnosis Date  . Arthritis   . Hypertension   . Dyslipidemia   . HH (hiatus hernia)   . Hemorrhoids   . Osteoporosis     OSTEOPENIA  . Smoker     QUIT IN 05/2007, Restarted  . ASHD (arteriosclerotic heart disease)     Distal circ occlusion 2010, nonobstructive disease elsewhere  . Incontinence     Past Surgical History  Procedure Date  . Tubal ligation   . Foot surgery   . Toe surgery   . Trigger finger release     ROS:  As stated in the HPI and negative for all other systems.  PHYSICAL EXAM BP 119/71  Pulse 61  Ht 5\' 3"  (1.6 m)  Wt 165 lb 12.8 oz (75.206 kg)  BMI 29.37 kg/m2 GENERAL:  Well appearing HEENT:  Pupils equal round and reactive, fundi not visualized, oral mucosa unremarkable NECK:  No jugular venous distention, waveform within normal limits, carotid upstroke brisk and symmetric, no bruits, no thyromegaly LYMPHATICS:  No cervical, inguinal adenopathy LUNGS:  Clear to auscultation bilaterally BACK:  No CVA tenderness CHEST:   Unremarkable HEART:  PMI not displaced or sustained,S1 and S2 within normal limits, no S3, no S4, no clicks, no rubs, no murmurs ABD:  Flat, positive bowel sounds normal in frequency in pitch, no bruits, no rebound, no guarding, no midline pulsatile mass, no hepatomegaly, no splenomegaly EXT:  2 plus pulses throughout, no edema, no cyanosis no clubbing, left foot in a soft cast SKIN:  No rashes no nodules NEURO:  Cranial nerves II through XII grossly intact, motor grossly intact throughout [facility]:  Cognitively intact, oriented to person place and time   EKG:  12/26/2011  Sinus rhythm, rate 61, axis within normal limits, intervals within normal limits, non specific ST-T wave changes with inferior and lateral mild ST depression and biphasic T waves unchanged from previous.   ASSESSMENT AND PLAN  CAD -  Given the chest pain described in the dyspnea she needs stress testing. She would not be able to walk a treadmill so she will have a [employer].  TOBACCO ABUSE -  I congratulated her percent smoking and hopefully she can continue to completely abstain.   ESSENTIAL HYPERTENSION, BENIGN -  The blood pressure is at target. No change in medications is indicated. We will continue with therapeutic lifestyle changes (TLC).   PURE HYPERCHOLESTEROLEMIA -  I will defer to [facility], MD with a goal LDL less than 100 and HDL greater than 40.

## 2012-01-01 ENCOUNTER — Encounter: Payer: Self-pay | Admitting: Cardiology

## 2012-01-08 ENCOUNTER — Ambulatory Visit (HOSPITAL_COMMUNITY): Payer: Medicare Other | Attending: Cardiology | Admitting: Radiology

## 2012-01-08 VITALS — Ht 63.0 in | Wt 166.0 lb

## 2012-01-08 DIAGNOSIS — R0609 Other forms of dyspnea: Secondary | ICD-10-CM | POA: Insufficient documentation

## 2012-01-08 DIAGNOSIS — R0602 Shortness of breath: Secondary | ICD-10-CM

## 2012-01-08 DIAGNOSIS — R51 Headache: Secondary | ICD-10-CM

## 2012-01-08 DIAGNOSIS — R0989 Other specified symptoms and signs involving the circulatory and respiratory systems: Secondary | ICD-10-CM | POA: Insufficient documentation

## 2012-01-08 DIAGNOSIS — I2510 Atherosclerotic heart disease of native coronary artery without angina pectoris: Secondary | ICD-10-CM

## 2012-01-08 DIAGNOSIS — R079 Chest pain, unspecified: Secondary | ICD-10-CM | POA: Insufficient documentation

## 2012-01-08 MED ORDER — REGADENOSON 0.4 MG/5ML IV SOLN
0.4000 mg | Freq: Once | INTRAVENOUS | Status: AC
  Administered 2012-01-08: 0.4 mg via INTRAVENOUS

## 2012-01-08 MED ORDER — TECHNETIUM TC 99M SESTAMIBI GENERIC - CARDIOLITE
33.0000 | Freq: Once | INTRAVENOUS | Status: AC | PRN
  Administered 2012-01-08: 33 via INTRAVENOUS

## 2012-01-08 MED ORDER — TECHNETIUM TC 99M SESTAMIBI GENERIC - CARDIOLITE
11.0000 | Freq: Once | INTRAVENOUS | Status: AC | PRN
  Administered 2012-01-08: 11 via INTRAVENOUS

## 2012-01-08 MED ORDER — AMINOPHYLLINE 25 MG/ML IV SOLN
75.0000 mg | Freq: Once | INTRAVENOUS | Status: AC
  Administered 2012-01-08: 75 mg via INTRAVENOUS

## 2012-01-08 NOTE — Progress Notes (Signed)
[facility] SITE 3 NUCLEAR MED [id number] [place] [address] [phone]  Cardiology Nuclear Med Study  [name] is a 71 y.o. female     MRN : [id number]     DOB: 05/17/1940  Procedure Date: 01/08/2012  Nuclear Med Background Indication for Stress Test:  Evaluation for Ischemia History: 05-09-08 Myocardial Perfusion Study-No ischemia, EF=74%> NSTEMI> 05-10-08 Cath: Nonobstructive CAD, EF=65%, and '10 Echo: EF=65%, Abnormal EKG Cardiac Risk Factors: Hypertension, Lipids and Smoker  Symptoms:Chest Pain with/without exertion (last occurrence one month ago),   DOE   Nuclear Pre-Procedure Caffeine/Decaff Intake:  None > 12 hrs NPO After: 8:30pm   Lungs:  clear O2 Sat: 95% on room air. IV 0.9% NS with Angio Cath:  22g  IV Site: R Antecubital x 1, tolerated well IV Started by:  Irean Hong, RN  Chest Size (in):  40 Cup Size: C  Height: 5\' 3"  (1.6 m)  Weight:  166 lb (75.297 kg)  BMI:  Body mass index is 29.41 kg/(m^2). Tech Comments:  Took Tenoretic this am per patient    Nuclear Med Study 1 or 2 day study: 1 day  Stress Test Type:  Treadmill/Lexiscan  Reading MD: Olga Millers, MD  Order Authorizing Provider:  Rollene Rotunda, MD  Resting Radionuclide: Technetium 55m Sestamibi  Resting Radionuclide Dose: 11.0 mCi   Stress Radionuclide:  Technetium 60m Sestamibi  Stress Radionuclide Dose: 33.0 mCi           Stress Protocol Rest HR: 54 Stress HR: 90  Rest BP: 125/77 Stress BP: 152/74  Exercise Time (min): 2:00 METS: n/a   Predicted Max HR: 151 bpm % Max HR: 59.6 bpm Rate Pressure Product: 62952   Dose of Adenosine (mg):  n/a Dose of Lexiscan: 0.4 mg  Dose of Atropine (mg): n/a Dose of Dobutamine: n/a mcg/kg/min (at max HR)  Stress Test Technologist: Irean Hong, RN  Nuclear Technologist:  Domenic Polite, CNMT     Rest Procedure:  Myocardial perfusion imaging was performed at rest 45 minutes following the intravenous  administration of Technetium 65m Sestamibi. Rest ECG: Sinus Bradycardia, nonspecific ST-T changes, Poor R wave progression  Stress Procedure:  The patient received IV Lexiscan 0.4 mg over 15-seconds with concurrent low level exercise and then Technetium 96m Sestamibi was injected at 30-seconds while the patient continued walking one more minute. The EKG was non diagnostic due to baseline  Changes. Quantitative spect images were obtained after a 45-minute delay. Aminophylline 75 mg IVP given after recovery at 12:40pm due to persistent headache. Stress ECG: No diagnostic ST segment change suggestive of ischemia (increased ST depression from baseline but not meeting criteria for ischemia).  QPS Raw Data Images:  Acquisition technically good; normal left ventricular size. Stress Images:  Normal homogeneous uptake in all areas of the myocardium. Rest Images:  Normal homogeneous uptake in all areas of the myocardium. Subtraction (SDS):  No evidence of ischemia. Transient Ischemic Dilatation (Normal <1.22):  1.01 Lung/Heart Ratio (Normal <0.45):  0.29  Quantitative Gated Spect Images QGS EDV:  63 ml QGS ESV:  10 ml  Impression Exercise Capacity:  Lexiscan with low level exercise. BP Response:  Normal blood pressure response. Clinical Symptoms:  There is dyspnea. ECG Impression:  No significant ST segment change suggestive of ischemia. Comparison with Prior Nuclear Study: No images to compare  Overall Impression:  Normal stress nuclear study.  LV Ejection Fraction: 75%.  LV Wall Motion:  NL LV Function; NL Wall Motion  [name]

## 2012-01-14 ENCOUNTER — Telehealth: Payer: Self-pay | Admitting: Cardiology

## 2012-01-14 NOTE — Telephone Encounter (Signed)
Pt rtn call to pam, pls call [phone]

## 2012-01-14 NOTE — Telephone Encounter (Signed)
Called twice - first time someone picked up but didn't say anything.  Called back and left a voice mail of normal results and to call back with questions

## 2012-05-11 ENCOUNTER — Encounter: Payer: Self-pay | Admitting: Neurology

## 2012-05-11 ENCOUNTER — Ambulatory Visit (INDEPENDENT_AMBULATORY_CARE_PROVIDER_SITE_OTHER): Payer: Medicare Other | Admitting: Neurology

## 2012-05-11 VITALS — BP 123/73 | HR 60 | Temp 97.9°F | Ht 63.0 in | Wt 168.0 lb

## 2012-05-11 DIAGNOSIS — G252 Other specified forms of tremor: Secondary | ICD-10-CM

## 2012-05-11 DIAGNOSIS — G250 Essential tremor: Secondary | ICD-10-CM

## 2012-05-11 DIAGNOSIS — F329 Major depressive disorder, single episode, unspecified: Secondary | ICD-10-CM

## 2012-05-11 DIAGNOSIS — Z9989 Dependence on other enabling machines and devices: Secondary | ICD-10-CM | POA: Insufficient documentation

## 2012-05-11 DIAGNOSIS — R443 Hallucinations, unspecified: Secondary | ICD-10-CM

## 2012-05-11 DIAGNOSIS — G4733 Obstructive sleep apnea (adult) (pediatric): Secondary | ICD-10-CM | POA: Insufficient documentation

## 2012-05-11 DIAGNOSIS — R413 Other amnesia: Secondary | ICD-10-CM

## 2012-05-11 DIAGNOSIS — F32A Depression, unspecified: Secondary | ICD-10-CM

## 2012-05-11 HISTORY — DX: Hallucinations, unspecified: R44.3

## 2012-05-11 HISTORY — DX: Obstructive sleep apnea (adult) (pediatric): G47.33

## 2012-05-11 HISTORY — DX: Depression, unspecified: F32.A

## 2012-05-11 HISTORY — DX: Essential tremor: G25.0

## 2012-05-11 HISTORY — DX: Other amnesia: R41.3

## 2012-05-11 NOTE — Patient Instructions (Addendum)
I think overall you are doing fairly well. You appear to be stable at this point.  Please make sure that you drink plenty of fluids. I would like for you to exercise daily for example in the form of walking 20-30 minutes every day. Please keep regular sleep-wake schedule. And keep regular mealtimes, do not skip any meals, eat  healthy snacks in between meals. Try to eat protein with every meal. Let's not make any changes in your medications at this point. I think you're stable enough that I can see you back in 6 months. Please call us if you have any interim questions, concerns, or problems or updates to discuss. Please try to quit smoking.  Brett Canales is my clinical assistant and will answer any of your questions and relay your messages to me.  Our phone number is [phone].

## 2012-05-11 NOTE — Progress Notes (Signed)
Subjective:    Patient ID: ZOEY BIDWELL is a 72 y.o. female.  HPI Interim history:  Interim history: Ms. Skeens is a very pleasant 72 year old left-handed woman who presents for followup consultation with me accompanied by her daughter. She is a former patient of Dr. Imagene Gurney and has been followed by him for at least 4 years. She has an underlying history of hypertension, hyperlipidemia, heart disease, depression, OSA, chronic lung disease and plantar fasciitis and was followed by Dr. love for her memory loss, with a differential diagnoses of Alzheimer's versus vascular dementia versus Lewy body dementia. He had tried Neurontin as above 5 mg daily but the patient had side effects including dizziness and the donezepil was discontinued. She also has a history of auditory and visual hallucinations for the past 3+ years. These happen intermittently. She was last seen by Dr. love on 01/07/2012 at which time he went over her neuropsychological test results. 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 It was thought that it was less likely she has Alzheimer's disease. It was possible that she could have lupus or dementia. On that visit he initiated to generic Aricept trial. She is maintained on risperidone 1.5 mg qHS, which helps the hallucinations. She was tried on a lower dose a few months ago, but did not do so well on the lower dose. She describes intermittent vertiginous Sx as well. She was stable with her memory, but last week had significant issues remembering names. She had R foot surgery on 04/07/12 for hammertoes. She had quite smoking for a total of 3 years, but restarted in 2011. She since then has intermittently re-started. She has never taken prescription medications for smoking cessation. She uses CPAP regularly without supplemental O2.  I reviewed her previous notes and records from Dr. love and below is a summary of that review:  72 year old LH female with a 3-1/2 year  history of VH and AH which come and go. She is followed by Dr. Donell Beers for depression, who started her on risperidone and the hallucinations resolved. She has been depressed following her husband's death. She has memory loss. She is independent in her ADLs and drives. She denies difficulty walking, visual loss, macular degeneration or sleep disturbance suggestive of RBD. Occasionally she has headaches.  CT head without contrast from 02/22/2010 showed atrophy particularly in the frontal regions and also bilateral cerebral hemisphere white matter disease. MRI brain without contrast 04/27/2010  showed subcortical white matter hyperintensities and  bifrontal cortical atrophy. B12, TSH, RPR, and  homocysteine were normal on 04/09/10. She denies bowel or bladder incontinence. She had a PSG on 05/09/2010 showing mild sleep apnea and severe desaturations as well as REM muscle tone increase. PLMs were noted that continued in REM sleep. CPAP titration on 06/03/2010 showed periodic leg movements and CPAP was titrated at 7 cm and oxygen was added at 3 L. On 03/03/2011 = MMSE 28/30, CDT 4/4, AFT 11. She denies falls. At times she feels dizzy when turning. She has gained weight since her smoking cessation. She lives alone. On 09/18/11 = MMSE 25/30, CDT 4/4, AFT 11. She has no family history of memory loss. She has a Hx of CP, followed by Dr. Antoine Poche. She has a Hx of recurrent UTIs, improved with the use of a pessary. On her last visit on 01/07/2012 = MMSE 27/30, CDT 4/4, AFT 8. 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  Her  Past Medical History  Diagnosis Date  . Arthritis   .  Hypertension   . Dyslipidemia   . HH (hiatus hernia)   . Hemorrhoids   . Osteoporosis     OSTEOPENIA  . Smoker     QUIT IN 05/2007, Restarted  . ASHD (arteriosclerotic heart disease)     Distal circ occlusion 2010, nonobstructive disease elsewhere  . Incontinence   . Memory loss 05/11/2012  . Chronic  depression 05/11/2012  . Hallucinations 05/11/2012  . OSA on CPAP 05/11/2012    Her  Past Surgical History  Procedure Laterality Date  . Tubal ligation    . Foot surgery    . Toe surgery    . Trigger finger release      Her FHx is significant for stroke, dementia, cancer, DM.   Her  History   Social History  . Marital Status: Married    Spouse Name: N/A    Number of Children: N/A  . Years of Education: N/A   Social History Main Topics  . Smoking status: Current Every Day Smoker -- 1.00 packs/day    Types: Cigarettes  . Smokeless tobacco: None  . Alcohol Use: None  . Drug Use: None  . Sexually Active: None   Other Topics Concern  . None   Social History Narrative  . None    Her No Known Allergies:   Her  Outpatient Encounter Prescriptions as of 05/11/2012  Medication Sig Dispense Refill  . acetaminophen (TYLENOL) 650 MG CR tablet Take 650 mg by mouth as needed.        Marland Kitchen aspirin 81 MG tablet Take 81 mg by mouth daily.        Marland Kitchen atenolol-chlorthalidone (TENORETIC) 50-25 MG per tablet Take 1 tablet by mouth daily.        Marland Kitchen atorvastatin (LIPITOR) 20 MG tablet Take 1 tablet (20 mg total) by mouth daily.  90 tablet  3  . buPROPion (WELLBUTRIN XL) 150 MG 24 hr tablet Take 450 mg by mouth every morning.       . calcium-vitamin D (OSCAL WITH D) 500-200 MG-UNIT per tablet Take 1 tablet by mouth daily.        . Coenzyme Q10 (CO Q 10) 100 MG CAPS Take by mouth daily.        . Diphenhydramine-APAP, sleep, (ACETAMINOPHEN PM PO) Take 2 tablets by mouth as needed.        . fish oil-omega-3 fatty acids 1000 MG capsule Take 2 g by mouth daily.        . folic acid (FOLVITE) 800 MCG tablet Take 400 mcg by mouth daily.        Marland Kitchen HYDROcodone-acetaminophen (NORCO/VICODIN) 5-325 MG per tablet       . niacin 500 MG tablet Take 500 mg by mouth 2 (two) times daily with a meal.       . nitroGLYCERIN (NITROSTAT) 0.4 MG SL tablet Place 0.4 mg under the tongue every 5 (five) minutes as needed.         Marland Kitchen omeprazole (PRILOSEC) 20 MG capsule Take 20 mg by mouth daily.        Marland Kitchen oxyCODONE-acetaminophen (PERCOCET) 7.5-325 MG per tablet       . potassium chloride SA (K-DUR,KLOR-CON) 20 MEQ tablet Take 20 mEq by mouth daily.        . risperiDONE (RISPERDAL) 1 MG tablet Take 1.5 mg by mouth daily.       . solifenacin (VESICARE) 10 MG tablet Take 5 mg by mouth daily.       Marland Kitchen terbinafine (  LAMISIL) 1 % cream Apply topically 2 (two) times daily.  30 g  0  . albuterol (PROAIR HFA) 108 (90 BASE) MCG/ACT inhaler Inhale 2 puffs into the lungs every 4 (four) hours as needed.  1 Inhaler  1  . cephALEXin (KEFLEX) 500 MG capsule       . donepezil (ARICEPT) 5 MG tablet       . indomethacin (INDOCIN) 25 MG capsule Take 25 mg by mouth 2 (two) times daily with a meal.       . sulfamethoxazole-trimethoprim (BACTRIM DS) 800-160 MG per tablet        No facility-administered encounter medications on file as of 05/11/2012.  :  The following portions of the patient's history were reviewed and updated as appropriate: current medications, past family history, past medical history, past social history, past surgical history and problem list.  Review of Systems  HENT: Positive for rhinorrhea.   Respiratory: Positive for shortness of breath.   Gastrointestinal: Positive for constipation.  Endocrine: Positive for cold intolerance (Feling cold).  Genitourinary: Positive for enuresis.  Musculoskeletal: Positive for arthralgias.       Muscle cramps   Neurological: Positive for dizziness.       Memory loss  Psychiatric/Behavioral:       Memory loss    Objective:  Neurologic Exam  Physical Exam  Physical Examination:   Filed Vitals:   05/11/12 0844  BP: 123/73  Pulse: 60  Temp: 97.9 F (36.6 C)    General Examination: The patient is a very pleasant 71 y.o. female in no acute distress.  HEENT: Normocephalic, atraumatic, pupils are equal, round and reactive to light and accommodation. Funduscopic exam is  normal with sharp disc margins noted. Extraocular tracking is good without nystagmus noted. Normal smooth pursuit is noted. Hearing is grossly intact. Tympanic membranes are clear bilaterally. Face is symmetric with normal facial animation and normal facial sensation. Speech is clear with no dysarthria noted. She has a mild, intermittent lip, and lower jaw tremor. Neck is supple with full range of motion. There are no carotid bruits on auscultation. Oropharynx exam reveals normal findings with the exception of mild mouth dryness. No significant airway crowding is noted. Mallampati is class II. Tongue protrudes centrally and palate elevates symmetrically.  Chest: is clear to auscultation without wheezing, rhonchi or crackles noted.  Heart: sounds are normal without murmurs, rubs or gallops noted.  Abdomen: is soft, non-tender and non-distended with normal bowel sounds appreciated on auscultation.  Extremities: There is no pitting edema in the distal lower extremities bilaterally. Pedal pulses are intact.  Skin: is warm and dry with no trophic changes noted.  Musculoskeletal: exam reveals no obvious joint deformities, tenderness or joint swelling or erythema.  Neurologically:  Mental status: The patient is awake, alert and oriented in all 4 spheres. Her memory, attention, language and knowledge are fairly appropriate. There is no aphasia, agnosia, apraxia or anomia. Speech is clear with normal prosody and enunciation. Thought process is linear. Mood is congruent normal. Affect is normal.  Cranial nerves are as described above under HEENT exam. In addition, shoulder shrug is normal with equal shoulder height noted. Motor exam: Normal bulk, strength and tone is noted. There is no drift, or rebound. She has a slight bilateral UE postural and action tremor, no resting tremor. Romberg is negative. Reflexes are 2+ throughout. Fine motor skills are intact with normal finger taps, normal hand movements,  normal rapid alternating patting, normal foot taps and normal foot agility.  Cerebellar testing shows no dysmetria or intention tremor on finger to nose testing. Heel to shin is unremarkable. There is no truncal or gait ataxia.  Sensory exam is intact to light touch, pinprick, vibration, temperature sense.  Gait, station and balance are unremarkable, with the exception that she is walking with a boot on her right foot.Marland Kitchen Posture is age-appropriate and stance is narrow based. No problems turning are noted. She is not able to perform tandem walk or heel walk or toe walk secondary to the boot.  Assessment and Plan:   Assessment and Plan:  In summary, BRANDYCE DIMARIO is a very pleasant 72 y.o.-year old female with a  History of memory loss going for 4 years or so. Her MMSE score is in the realm of mild cognitive impairment. She has a history of depression with hallucinations, controlled on a combination of Wellbutrin and Risperdal. Unfortunately she continues to smoke. She has a history of OSA, on CPAP. She also has a long-standing history of essential tremor and gives a family history strongly positive for essential tremor. This is rather mild and does not warrant treatment. I had a long chat with the patient and  her stepdaughter about my findings and the diagnoses, prognosis and treatment options. We talked about medical treatments and non-pharmacological approaches. We talked about smoking cessation and maintaining a healthy lifestyle in general. I encouraged the patient to eat healthy, exercise daily and keep well hydrated, to keep a scheduled bedtime and wake time routine, to not skip any meals and eat healthy snacks in between meals.  I recommended the following at this time:  I would like for her to continue her current medications and would not suggest initiating anything new at this moment. I would like to follow her along in most likely 6 monthly intervals. Down the Road we can repeat her  neuropsychological testing but it has only been a few months since her exam.  I answered all their questions today and the patient and they were in agreement. I would like to see her back in 6 months, sooner if the need arises and encouraged them to call with any interim questions, concerns, problems or updates.

## 2012-09-16 ENCOUNTER — Telehealth: Payer: Self-pay | Admitting: Neurology

## 2012-10-09 ENCOUNTER — Emergency Department (HOSPITAL_COMMUNITY): Payer: Medicare Other

## 2012-10-09 ENCOUNTER — Inpatient Hospital Stay (HOSPITAL_COMMUNITY)
Admission: EM | Admit: 2012-10-09 | Discharge: 2012-10-19 | DRG: 064 | Disposition: E | Payer: Medicare Other | Attending: Critical Care Medicine | Admitting: Critical Care Medicine

## 2012-10-09 ENCOUNTER — Encounter (HOSPITAL_COMMUNITY): Payer: Self-pay | Admitting: Emergency Medicine

## 2012-10-09 DIAGNOSIS — E785 Hyperlipidemia, unspecified: Secondary | ICD-10-CM | POA: Diagnosis present

## 2012-10-09 DIAGNOSIS — F0390 Unspecified dementia without behavioral disturbance: Secondary | ICD-10-CM | POA: Diagnosis present

## 2012-10-09 DIAGNOSIS — I619 Nontraumatic intracerebral hemorrhage, unspecified: Principal | ICD-10-CM

## 2012-10-09 DIAGNOSIS — I10 Essential (primary) hypertension: Secondary | ICD-10-CM

## 2012-10-09 DIAGNOSIS — M129 Arthropathy, unspecified: Secondary | ICD-10-CM | POA: Diagnosis present

## 2012-10-09 DIAGNOSIS — I615 Nontraumatic intracerebral hemorrhage, intraventricular: Secondary | ICD-10-CM | POA: Diagnosis present

## 2012-10-09 DIAGNOSIS — F17200 Nicotine dependence, unspecified, uncomplicated: Secondary | ICD-10-CM | POA: Diagnosis present

## 2012-10-09 DIAGNOSIS — M810 Age-related osteoporosis without current pathological fracture: Secondary | ICD-10-CM | POA: Diagnosis present

## 2012-10-09 DIAGNOSIS — J9600 Acute respiratory failure, unspecified whether with hypoxia or hypercapnia: Secondary | ICD-10-CM | POA: Diagnosis present

## 2012-10-09 DIAGNOSIS — I609 Nontraumatic subarachnoid hemorrhage, unspecified: Secondary | ICD-10-CM

## 2012-10-09 DIAGNOSIS — Z66 Do not resuscitate: Secondary | ICD-10-CM | POA: Diagnosis not present

## 2012-10-09 DIAGNOSIS — G4733 Obstructive sleep apnea (adult) (pediatric): Secondary | ICD-10-CM | POA: Diagnosis present

## 2012-10-09 DIAGNOSIS — F329 Major depressive disorder, single episode, unspecified: Secondary | ICD-10-CM | POA: Diagnosis present

## 2012-10-09 DIAGNOSIS — F3289 Other specified depressive episodes: Secondary | ICD-10-CM | POA: Diagnosis present

## 2012-10-09 DIAGNOSIS — J690 Pneumonitis due to inhalation of food and vomit: Secondary | ICD-10-CM | POA: Diagnosis present

## 2012-10-09 DIAGNOSIS — G250 Essential tremor: Secondary | ICD-10-CM | POA: Diagnosis present

## 2012-10-09 DIAGNOSIS — I2510 Atherosclerotic heart disease of native coronary artery without angina pectoris: Secondary | ICD-10-CM | POA: Diagnosis present

## 2012-10-09 DIAGNOSIS — Z515 Encounter for palliative care: Secondary | ICD-10-CM

## 2012-10-09 DIAGNOSIS — E876 Hypokalemia: Secondary | ICD-10-CM | POA: Diagnosis present

## 2012-10-09 LAB — URINALYSIS, ROUTINE W REFLEX MICROSCOPIC
Hgb urine dipstick: NEGATIVE
Ketones, ur: NEGATIVE mg/dL
Protein, ur: NEGATIVE mg/dL
Urobilinogen, UA: 0.2 mg/dL (ref 0.0–1.0)

## 2012-10-09 LAB — COMPREHENSIVE METABOLIC PANEL
AST: 20 U/L (ref 0–37)
Albumin: 3.8 g/dL (ref 3.5–5.2)
Alkaline Phosphatase: 70 U/L (ref 39–117)
Chloride: 100 mEq/L (ref 96–112)
Potassium: 3.4 mEq/L — ABNORMAL LOW (ref 3.5–5.1)
Sodium: 137 mEq/L (ref 135–145)
Total Bilirubin: 0.3 mg/dL (ref 0.3–1.2)

## 2012-10-09 LAB — CBC WITH DIFFERENTIAL/PLATELET
Basophils Absolute: 0 10*3/uL (ref 0.0–0.1)
Eosinophils Relative: 1 % (ref 0–5)
Lymphocytes Relative: 24 % (ref 12–46)
MCV: 86.7 fL (ref 78.0–100.0)
Neutrophils Relative %: 63 % (ref 43–77)
Platelets: 228 10*3/uL (ref 150–400)
RDW: 12.9 % (ref 11.5–15.5)
WBC: 8.4 10*3/uL (ref 4.0–10.5)

## 2012-10-09 LAB — POCT I-STAT TROPONIN I: Troponin i, poc: 0.02 ng/mL (ref 0.00–0.08)

## 2012-10-09 LAB — URINE MICROSCOPIC-ADD ON

## 2012-10-09 LAB — RAPID URINE DRUG SCREEN, HOSP PERFORMED
Amphetamines: NOT DETECTED
Tetrahydrocannabinol: NOT DETECTED

## 2012-10-09 LAB — POCT I-STAT, CHEM 8
BUN: 8 mg/dL (ref 6–23)
Hemoglobin: 12.9 g/dL (ref 12.0–15.0)
Sodium: 138 mEq/L (ref 135–145)
TCO2: 24 mmol/L (ref 0–100)

## 2012-10-09 LAB — PROTIME-INR: INR: 0.99 (ref 0.00–1.49)

## 2012-10-09 LAB — APTT: aPTT: 25 seconds (ref 24–37)

## 2012-10-09 MED ORDER — PROPOFOL 10 MG/ML IV EMUL
INTRAVENOUS | Status: AC
  Administered 2012-10-09: 1000 mg
  Filled 2012-10-09: qty 100

## 2012-10-09 MED ORDER — FENTANYL CITRATE 0.05 MG/ML IJ SOLN: 25.0000 ug | INTRAMUSCULAR | Status: DC | PRN

## 2012-10-09 MED ORDER — INSULIN ASPART 100 UNIT/ML ~~LOC~~ SOLN: 0.0000 [IU] | SUBCUTANEOUS | Status: DC

## 2012-10-09 MED ORDER — LORAZEPAM BOLUS VIA INFUSION
2.0000 mg | INTRAVENOUS | Status: DC | PRN
  Filled 2012-10-09: qty 5

## 2012-10-09 MED ORDER — ATROPINE SULFATE 1 % OP SOLN
4.0000 [drp] | OPHTHALMIC | Status: DC | PRN
  Filled 2012-10-09: qty 2

## 2012-10-09 MED ORDER — HYDRALAZINE HCL 20 MG/ML IJ SOLN: 10.0000 mg | INTRAMUSCULAR | Status: DC | PRN

## 2012-10-09 MED ORDER — LORAZEPAM 2 MG/ML IJ SOLN
1.0000 mg/h | INTRAVENOUS | Status: DC
  Filled 2012-10-09: qty 25

## 2012-10-09 MED ORDER — MORPHINE BOLUS VIA INFUSION
5.0000 mg | INTRAVENOUS | Status: DC | PRN
  Filled 2012-10-09: qty 20

## 2012-10-09 MED ORDER — ACETAMINOPHEN 650 MG RE SUPP: 650.0000 mg | Freq: Four times a day (QID) | RECTAL | Status: DC | PRN

## 2012-10-09 MED ORDER — HYDRALAZINE HCL 20 MG/ML IJ SOLN
INTRAMUSCULAR | Status: AC
  Administered 2012-10-09: 10 mg
  Filled 2012-10-09: qty 1

## 2012-10-09 MED ORDER — SODIUM CHLORIDE 0.9 % IV SOLN
3.0000 g | Freq: Four times a day (QID) | INTRAVENOUS | Status: DC
  Administered 2012-10-09: 3 g via INTRAVENOUS
  Filled 2012-10-09 (×3): qty 3

## 2012-10-09 MED ORDER — MORPHINE SULFATE 25 MG/ML IV SOLN
1.0000 mg/h | INTRAVENOUS | Status: DC
  Administered 2012-10-10: 2 mg/h via INTRAVENOUS
  Filled 2012-10-09: qty 10

## 2012-10-09 MED ORDER — SODIUM CHLORIDE 0.9 % IV SOLN: 250.0000 mL | INTRAVENOUS | Status: DC | PRN

## 2012-10-09 MED ORDER — ROCURONIUM BROMIDE 50 MG/5ML IV SOLN
100.0000 mg | Freq: Once | INTRAVENOUS | Status: AC
  Administered 2012-10-09: 100 mg via INTRAVENOUS

## 2012-10-09 MED ORDER — POTASSIUM CHLORIDE 20 MEQ/15ML (10%) PO LIQD
40.0000 meq | Freq: Once | ORAL | Status: DC
  Filled 2012-10-09: qty 30

## 2012-10-09 MED ORDER — ETOMIDATE 2 MG/ML IV SOLN
30.0000 mg | Freq: Once | INTRAVENOUS | Status: AC
  Administered 2012-10-09: 30 mg via INTRAVENOUS

## 2012-10-09 MED ORDER — SODIUM CHLORIDE 0.9 % IV SOLN
INTRAVENOUS | Status: DC
  Administered 2012-10-09: 20:00:00 via INTRAVENOUS

## 2012-10-09 MED ORDER — PANTOPRAZOLE SODIUM 40 MG IV SOLR: 40.0000 mg | Freq: Every day | INTRAVENOUS | Status: DC

## 2012-10-09 NOTE — ED Notes (Signed)
Report called to [phone] 

## 2012-10-09 NOTE — ED Notes (Signed)
The pt returned from c-t accompanied by the stroke team.  Pt cleaned from the initial vomiting.

## 2012-10-09 NOTE — ED Notes (Signed)
Temp foley inserted urine sent for ua and culture

## 2012-10-09 NOTE — Consult Note (Signed)
Referring Physician: Dr. Rubin Payor    Chief Complaint: Sudden onset of severe headache with nausea and rapidly developing stupor and left hemiplegia.  HPI: [name] is an 72 y.o. female history of hypertension, hyperlipidemia, obstructive sleep apnea, atherosclerotic heart disease and dementia, brought to the emergency room following acute onset of lethargy and stupor as well as nausea and left hemiplegia. Symptoms began with sudden severe headache. Patient was intubated after arriving in the emergency room because of progressive desaturation and reduced gag response. CT scan of her head showed a large intraparenchymal hemorrhage involving the right parietal and temporal lobes with 12 mm right to left midline shift as well as extension of blood into subarachnoid spaces and right lateral ventricle. NIH stroke score was 26.  LSN: 3:15 PM on 09/30/2012 tPA Given: No: ICH, SAH MRankin: 4  Past Medical History  Diagnosis Date  . Arthritis   . Hypertension   . Dyslipidemia   . HH (hiatus hernia)   . Hemorrhoids   . Osteoporosis     OSTEOPENIA  . Smoker     QUIT IN 05/2007, Restarted  . ASHD (arteriosclerotic heart disease)     Distal circ occlusion 2010, nonobstructive disease elsewhere  . Incontinence   . Memory loss 05/11/2012  . Chronic depression 05/11/2012  . Hallucinations 05/11/2012  . OSA on CPAP 05/11/2012  . Tremor, essential 05/11/2012    History reviewed. No pertinent family history.   Medications: I have reviewed the patient's current medications.  ROS: Unavailable.  Physical Examination: There were no vitals taken for this visit.  Neurologic Examination: Minimal response to verbal and tactile stimuli prior to intubation. Pupils were equal and reacted sluggishly to light. Patient had strong tonic gaze to the right side. Moderate left lower facial weakness was noted. Motor exam showed flaccid hemiplegia on the left; strength of right extremities was normal. Deep  tendon reflexes were trace only and symmetrical. Plantar response on the left was extensor on the right flexor.  Ct Head Wo Contrast  09/18/2012   *RADIOLOGY REPORT*  Clinical Data: Headache, unresponsive.  CT HEAD WITHOUT CONTRAST  Technique:  Contiguous axial images were obtained from the base of the skull through the vertex without contrast.  Comparison: February 22, 2010.  Findings: Bony calvarium appears intact.  Large intraparenchymal hemorrhage is seen in the right parietal temporal lobes measuring 5.0 x 4.6 cm.  This results in 12 mm of midline shift to the left. Hemorrhage is also noted in the right lateral ventricle, third ventricle and fourth ventricle.  Subarachnoid hemorrhage is noted bilaterally in the sylvian fissures, basal cisterns and anterior interhemispheric fissure.  IMPRESSION: Large intraparenchymal hemorrhage noted in right parietal and temporal lobes resulting in significant midline shift to the left. Intraventricular and subarachnoid hemorrhage is also noted.  Critical Value/emergent results were called by telephone at the time of interpretation on October 09, 2012 at 04:55 p.m. to Dr. Lu Duffel, who verbally acknowledged these results.   Original Report Authenticated By: Lupita Raider.,  M.D.   [facility] 1 View  09/18/2012   *RADIOLOGY REPORT*  Clinical Data: Weakness.  Intubation.  PORTABLE CHEST - 1 VIEW  Comparison: 01/29/2010  Findings: A metallic bar artifact is present over the mid aspect of the chest due to artifact from the trauma stretcher.  The image was not repeated as the patient was getting ready to be transported to CT.  An endotracheal tube is in place and the tip projects over the distal trachea approximately  1 cm above the suspected level of the carina.  Heart size is within normal limits.  Some rotation towards the right is present and prominence of the mediastinal contour is felt likely to be positional.  The visualized lung fields are clear with no pneumothorax,  focal infiltrate, pleural fluid or congestive failure.  Visualized bony structures appear intact.  IMPRESSION: Rotated film with artifact from the trauma stretcher somewhat compromising evaluation.  No obvious focal abnormalitie is seen.  Endotracheal tube position as above.  Consider repeating the exam once the patient is stabilized for improved assessment   Original Report Authenticated By: Rhodia Albright, M.D.    Assessment: 72 y.o. female 72 year old lady presenting with acute large intraparenchymal hemorrhage with subarachnoid extension as well as intraventricular extension of hemorrhage. There is mass effect with 12 mm right to left midline shift. Prognosis for survival is very poor.  Recommend: Aggressive therapy only to the extent of family's wishes. I would be in agreement with comfort measures only, any treatment measures would likely be futile.  Stroke Risk Factors - hyperlipidemia and hypertension  C.R. Roseanne Reno, MD [facility] [phone]  10/14/2012, 5:03 PM

## 2012-10-09 NOTE — Progress Notes (Signed)
Spoke with patient's daughter and son, Boneta Lucks and Gerlene Burdock, about her current injury. They both agreed that given the severity of her injury that she would not want to continue on like this. They have requested that she be made DNR. A local sister would like to see her in the next few hours - once she sees her we will withdraw care. If she arrests before the family member gets here we will not intervene. I will change her orders to reflect the current plan.

## 2012-10-09 NOTE — ED Notes (Signed)
The pts pupils are equal 2.0 ?? reaction

## 2012-10-09 NOTE — ED Notes (Signed)
The pt has no family in town .  A daughter lives in [place].   Neighbors here with the pt.  The paster and a friend were talking to the pt when the pt groaned and  Grabbed her head.  They called  [phone].  The pt lives alone and  In the past has  Moved around on her own

## 2012-10-09 NOTE — ED Notes (Signed)
To ct

## 2012-10-09 NOTE — ED Notes (Signed)
BIB [facility] unresponsive

## 2012-10-09 NOTE — ED Provider Notes (Signed)
CSN: [id number]     Arrival date & time 09/22/2012  1554 History     First MD Initiated Contact with Patient 09/28/2012 1601     Chief Complaint  Patient presents with  . Code Stroke   (Consider location/radiation/quality/duration/timing/severity/associated sxs/prior Treatment) Patient is a 72 y.o. female presenting with altered mental status and general illness. The history is provided by the EMS personnel.  Altered Mental Status Presenting symptoms: unresponsiveness   Severity:  Severe Most recent episode:  Today Episode history:  Continuous Timing:  Constant Progression:  Unchanged Chronicity:  New Illness Severity:  Severe Onset quality:  Sudden Timing:  Constant Progression:  Unchanged   Past Medical History  Diagnosis Date  . Arthritis   . Hypertension   . Dyslipidemia   . HH (hiatus hernia)   . Hemorrhoids   . Osteoporosis     OSTEOPENIA  . Smoker     QUIT IN 05/2007, Restarted  . ASHD (arteriosclerotic heart disease)     Distal circ occlusion 2010, nonobstructive disease elsewhere  . Incontinence   . Memory loss 05/11/2012  . Chronic depression 05/11/2012  . Hallucinations 05/11/2012  . OSA on CPAP 05/11/2012  . Tremor, essential 05/11/2012   Past Surgical History  Procedure Laterality Date  . Tubal ligation    . Foot surgery    . Toe surgery    . Trigger finger release     History reviewed. No pertinent family history. History  Substance Use Topics  . Smoking status: Current Every Day Smoker -- 1.00 packs/day    Types: Cigarettes  . Smokeless tobacco: Not on file  . Alcohol Use: Not on file   OB History   Grav Para Term Preterm Abortions TAB SAB Ect Mult Living                 Review of Systems  Unable to perform ROS: Patient unresponsive    Allergies  Review of patient's allergies indicates no known allergies.  Home Medications   No current outpatient prescriptions on file. BP 117/58  Pulse 91  Temp(Src) 96.3 F (35.7 C) (Core  (Comment))  Resp 17  Ht 5\' 3"  (1.6 m)  Wt 169 lb 1.5 oz (76.7 kg)  BMI 29.96 kg/m2  SpO2 100% Physical Exam  Nursing note and vitals reviewed. Constitutional: She appears well-developed and well-nourished. She appears distressed.  HENT:  Head: Normocephalic and atraumatic.  Right Ear: External ear normal.  Left Ear: External ear normal.  Nose: Nose normal.  Mouth/Throat: Oropharynx is clear and moist. No oropharyngeal exudate.  Pooling of saliva in the mouth  Eyes: Conjunctivae are normal. Right eye exhibits no discharge. Left eye exhibits no discharge.  Neck: Normal range of motion. Neck supple. No JVD present. No tracheal deviation present. No thyromegaly present.  Cardiovascular: Normal rate, regular rhythm, normal heart sounds and intact distal pulses.  Exam reveals no gallop and no friction rub.   No murmur heard. Pulmonary/Chest: She is in respiratory distress. She has no wheezes. She has rhonchi. She has no rales.  Patient with shallow breathing  Abdominal: Soft. Bowel sounds are normal. She exhibits no distension. There is no rebound and no guarding.  Lymphadenopathy:    She has no cervical adenopathy.  Neurological: GCS eye subscore is 4. GCS verbal subscore is 1. GCS motor subscore is 3.  Patient staring to the right. Does not seem to respond to stimuli on the left. Does not move at all on the left even with painful stimuli.  Patient with diminished gag and corneal reflexes  Skin: Skin is warm. She is diaphoretic. There is pallor.    ED Course   INTUBATION Date/Time: 09/30/2012 4:44 PM Performed by: Sherryl Manges Authorized by: Billee Cashing Consent: The procedure was performed in an emergent situation. Patient identity confirmed: arm band and hospital-assigned identification number Time out: Immediately prior to procedure a "time out" was called to verify the correct patient, procedure, equipment, support staff and site/side marked as required. Intubation  method: fiberoptic oral Patient status: paralyzed (RSI) Preoxygenation: BVM Pretreatment medications: none Sedatives: etomidate Paralytic: rocuronium Laryngoscope size: Miller 3 Tube size: 6.5 mm Tube type: cuffed Number of attempts: 1 Cords visualized: yes Post-procedure assessment: chest rise and CO2 detector Breath sounds: equal and absent over the epigastrium Cuff inflated: yes ETT to lip: 22 cm ETT to teeth: 23 cm Tube secured with: ETT holder Chest x-ray interpreted by me and radiologist. Chest x-ray findings: endotracheal tube in appropriate position Patient tolerance: Patient tolerated the procedure well with no immediate complications.   (including critical care time)  Labs Reviewed  COMPREHENSIVE METABOLIC PANEL - Abnormal; Notable for the following:    Potassium 3.4 (*)    Glucose, Bld 181 (*)    GFR calc non Af Amer 60 (*)    GFR calc Af Amer 70 (*)    All other components within normal limits  URINALYSIS, ROUTINE W REFLEX MICROSCOPIC - Abnormal; Notable for the following:    APPearance CLOUDY (*)    Nitrite POSITIVE (*)    All other components within normal limits  URINE MICROSCOPIC-ADD ON - Abnormal; Notable for the following:    Bacteria, UA MANY (*)    All other components within normal limits  POCT I-STAT, CHEM 8 - Abnormal; Notable for the following:    Potassium 3.4 (*)    Glucose, Bld 178 (*)    All other components within normal limits  MRSA PCR SCREENING  URINE CULTURE  ETHANOL  PROTIME-INR  APTT  TROPONIN I  URINE RAPID DRUG SCREEN (HOSP PERFORMED)  CBC WITH DIFFERENTIAL  POCT I-STAT TROPONIN I   Ct Head Wo Contrast  09/19/2012   *RADIOLOGY REPORT*  Clinical Data: Headache, unresponsive.  CT HEAD WITHOUT CONTRAST  Technique:  Contiguous axial images were obtained from the base of the skull through the vertex without contrast.  Comparison: February 22, 2010.  Findings: Bony calvarium appears intact.  Large intraparenchymal hemorrhage is seen in  the right parietal temporal lobes measuring 5.0 x 4.6 cm.  This results in 12 mm of midline shift to the left. Hemorrhage is also noted in the right lateral ventricle, third ventricle and fourth ventricle.  Subarachnoid hemorrhage is noted bilaterally in the sylvian fissures, basal cisterns and anterior interhemispheric fissure.  IMPRESSION: Large intraparenchymal hemorrhage noted in right parietal and temporal lobes resulting in significant midline shift to the left. Intraventricular and subarachnoid hemorrhage is also noted.  Critical Value/emergent results were called by telephone at the time of interpretation on October 09, 2012 at 04:55 p.m. to Dr. Lu Duffel, who verbally acknowledged these results.   Original Report Authenticated By: Lupita Raider.,  M.D.   [facility] 1 View  09/21/2012   *RADIOLOGY REPORT*  Clinical Data: Weakness.  Intubation.  PORTABLE CHEST - 1 VIEW  Comparison: 01/29/2010  Findings: A metallic bar artifact is present over the mid aspect of the chest due to artifact from the trauma stretcher.  The image was not repeated as the patient was getting  ready to be transported to CT.  An endotracheal tube is in place and the tip projects over the distal trachea approximately 1 cm above the suspected level of the carina.  Heart size is within normal limits.  Some rotation towards the right is present and prominence of the mediastinal contour is felt likely to be positional.  The visualized lung fields are clear with no pneumothorax, focal infiltrate, pleural fluid or congestive failure.  Visualized bony structures appear intact.  IMPRESSION: Rotated film with artifact from the trauma stretcher somewhat compromising evaluation.  No obvious focal abnormalitie is seen.  Endotracheal tube position as above.  Consider repeating the exam once the patient is stabilized for improved assessment   Original Report Authenticated By: Rhodia Albright, M.D.   1. Intracerebral hemorrhage   2.  Intraventricular hemorrhage   3. Subarachnoid hemorrhage   4. Acute respiratory failure   5. Essential hypertension, benign     MDM  72 yr old F pt here after collapsing with a bad headache. I spoke with her pastor who was visiting with her when this happened. He said that she abruptly stated that she had a HA and then started getting weak on the left side of her body. He then called EMS. Patient became less and less responsive with EMS. Patient could not protect her airway and had depressed mental status. She had concerning neuro exam detailed above. She was intubated and then sent to CT which shows a large head bleed.   Date: 10/15/12  Rate: 92  Rhythm: normal sinus rhythm  QRS Axis: normal  Intervals: normal  ST/T Wave abnormalities: nonspecific ST changes  Conduction Disutrbances:none  Narrative Interpretation:   Old EKG Reviewed: changes noted  Patient will be admitted to ICU for further care  Case discussed with Dr. Benjamin Stain, MD 2012-10-15 [phone]

## 2012-10-09 NOTE — Progress Notes (Signed)
One mg atropine given at [employer]

## 2012-10-09 NOTE — Progress Notes (Signed)
Dr Delford Field was updated about pt's current condition. Orders received and followed. Dr. Curt Bears arrived to assess the situation.

## 2012-10-09 NOTE — ED Notes (Signed)
The minister at bedside has talked to a son in [place] and the son has spoken by phone to the ed res.   The son will contact the rest of the family

## 2012-10-09 NOTE — Progress Notes (Signed)
[name] from [facility] reported that the pt is not a candidate. Continue to monitor the pt. And provide support to the family.

## 2012-10-09 NOTE — H&P (Signed)
PULMONARY  / CRITICAL CARE MEDICINE  Name: [name] MRN: [id number] DOB: 08/28/1940    ADMISSION DATE:  11-08-2012 CONSULTATION DATE:  08-Nov-2012   REFERRING MD :  EDP PRIMARY SERVICE: PCCM  CHIEF COMPLAINT:  unresponsive  BRIEF PATIENT DESCRIPTION: 72/F, smoker with  Dementia BIBEMS from her pastor's office due to sudden onset unresponsiveness, bagged on arrival with vomiting , intubated  For airway protection. Flaccid extremities with NIHSS 26. BP 160 sys on arrival, head CT -large ICH with IV & sub arachnoid extension  SIGNIFICANT EVENTS / STUDIES:  8/22 head CT >>Large intraparenchymal hemorrhage noted in right parietal and  temporal lobes resulting in significant midline shift to the left.  Intraventricular and subarachnoid hemorrhage is also noted.   LINES / TUBES: ETT 6.5 8/22 >>  CULTURES: resp 8/22 >>  ANTIBIOTICS: unasyn 8/22 >>  HISTORY OF PRESENT ILLNESS:   72/F with  Dementia BIBEMS from her pastor's office due to sudden onset unresponsiveness, bagged on arrival with vomiting , intubated  For airway protection. Flaccid extremities with NIHSS 26 She has a history of hypertension, hyperlipidemia, heart disease, depression, OSA on CPAP and plantar fasciitis and was followed by Dr. 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 Arthritis   . Hypertension   . Dyslipidemia   . HH (hiatus hernia)   . Hemorrhoids   . Osteoporosis     OSTEOPENIA  . Smoker     QUIT IN 05/2007, Restarted  . ASHD (arteriosclerotic heart disease)     Distal circ occlusion 2010, nonobstructive disease elsewhere  . Incontinence   . Memory loss 05/11/2012  . Chronic depression 05/11/2012  . Hallucinations 05/11/2012  . OSA on CPAP 05/11/2012  . Tremor, essential 05/11/2012   Past Surgical History  Procedure Laterality Date  . Tubal ligation    . Foot  surgery    . Toe surgery    . Trigger finger release     Prior to Admission medications   Medication Sig Start Date End Date Taking? Authorizing Provider  acetaminophen (TYLENOL) 650 MG CR tablet Take 650 mg by mouth as needed.      Historical Provider, MD  albuterol (PROAIR HFA) 108 (90 BASE) MCG/ACT inhaler Inhale 2 puffs into the lungs every 4 (four) hours as needed. 09/18/10 01/25/12  Ronnald Nian, MD  aspirin 81 MG tablet Take 81 mg by mouth daily.      Historical Provider, MD  atenolol-chlorthalidone (TENORETIC) 50-25 MG per tablet Take 1 tablet by mouth daily.      Historical Provider, MD  atorvastatin (LIPITOR) 20 MG tablet Take 1 tablet (20 mg total) by mouth daily. 10/23/10   Ronnald Nian, MD  buPROPion (WELLBUTRIN XL) 150 MG 24 hr tablet Take 450 mg by mouth every morning.     Historical Provider, MD  calcium-vitamin D (OSCAL WITH D) 500-200 MG-UNIT per tablet Take 1 tablet by mouth daily.      Historical Provider, MD  cephALEXin (KEFLEX) 500 MG capsule  10/21/11   Historical Provider, MD  Coenzyme Q10 (CO Q 10) 100 MG CAPS Take by mouth daily.      Historical Provider, MD  Diphenhydramine-APAP, sleep, (ACETAMINOPHEN PM PO) Take 2 tablets by mouth as needed.      Historical Provider, MD  donepezil (ARICEPT) 5 MG tablet  02/08/12   Historical Provider,  MD  fish oil-omega-3 fatty acids 1000 MG capsule Take 2 g by mouth daily.      Historical Provider, MD  folic acid (FOLVITE) 800 MCG tablet Take 400 mcg by mouth daily.      Historical Provider, MD  HYDROcodone-acetaminophen (NORCO/VICODIN) 5-325 MG per tablet  04/07/12   Historical Provider, MD  indomethacin (INDOCIN) 25 MG capsule Take 25 mg by mouth 2 (two) times daily with a meal.     Historical Provider, MD  niacin 500 MG tablet Take 500 mg by mouth 2 (two) times daily with a meal.     Historical Provider, MD  nitroGLYCERIN (NITROSTAT) 0.4 MG SL tablet Place 0.4 mg under the tongue every 5 (five) minutes as needed.      Historical  Provider, MD  omeprazole (PRILOSEC) 20 MG capsule Take 20 mg by mouth daily.      Historical Provider, MD  oxyCODONE-acetaminophen (PERCOCET) 7.5-325 MG per tablet  12/10/11   Historical Provider, MD  potassium chloride SA (K-DUR,KLOR-CON) 20 MEQ tablet Take 20 mEq by mouth daily.      Historical Provider, MD  risperiDONE (RISPERDAL) 1 MG tablet Take 1.5 mg by mouth daily.     Historical Provider, MD  solifenacin (VESICARE) 10 MG tablet Take 5 mg by mouth daily.     Historical Provider, MD  sulfamethoxazole-trimethoprim (BACTRIM DS) 800-160 MG per tablet  10/30/11   Historical Provider, MD  terbinafine (LAMISIL) 1 % cream Apply topically 2 (two) times daily. 10/21/11 10/20/12  Linna Hoff, MD   No Known Allergies  FAMILY HISTORY:  No family history on file. SOCIAL HISTORY:  reports that she has been smoking Cigarettes.  She has been smoking about 1.00 pack per day. She does not have any smokeless tobacco history on file. Her alcohol and drug histories are not on file.  REVIEW OF SYSTEMS:  unobtainable  SUBJECTIVE:   VITAL SIGNS:   HEMODYNAMICS:   VENTILATOR SETTINGS:   INTAKE / OUTPUT: Intake/Output   None     PHYSICAL EXAMINATION: Gen.  well-nourished, in no distress, oral ETT ENT - no lesions, no post nasal drip, pupils 3mm NRTL Neck: No JVD, no thyromegaly, no carotid bruits Lungs: no use of accessory muscles, no dullness to percussion, clear without rales or rhonchi  Cardiovascular: Rhythm regular, heart sounds  normal, no murmurs, no peripheral edema Abdomen: soft and non-tender, no hepatosplenomegaly, BS normal. Musculoskeletal: No deformities, no cyanosis or clubbing Neuro:  Sedated , paralysed Skin:  Warm, no lesions/ rash   LABS:  CBC Recent Labs       1603  10/08/2012  1606  WBC  8.4   --   HGB  13.1  12.9  HCT  36.5  38.0  PLT  228   --    Coag's Recent Labs     09/28/2012  1603  APTT  25  INR  0.99   BMET Recent Labs     09/23/2012  1606   NA  138  K  3.4*  CL  102  BUN  8  CREATININE  1.10  GLUCOSE  178*   Electrolytes No results found for this basename: CALCIUM, MG, PHOS,  in the last 72 hours Sepsis Markers No results found for this basename: LACTICACIDVEN, PROCALCITON, O2SATVEN,  in the last 72 hours ABG No results found for this basename: PHART, PCO2ART, PO2ART,  in the last 72 hours Liver Enzymes No results found for this basename: AST, ALT, ALKPHOS, BILITOT, ALBUMIN,  in  the last 72 hours Cardiac Enzymes No results found for this basename: TROPONINI, PROBNP,  in the last 72 hours Glucose No results found for this basename: GLUCAP,  in the last 72 hours  Imaging Dg Chest [facility] 1 View  09/27/2012   *RADIOLOGY REPORT*  Clinical Data: Weakness.  Intubation.  PORTABLE CHEST - 1 VIEW  Comparison: 01/29/2010  Findings: A metallic bar artifact is present over the mid aspect of the chest due to artifact from the trauma stretcher.  The image was not repeated as the patient was getting ready to be transported to CT.  An endotracheal tube is in place and the tip projects over the distal trachea approximately 1 cm above the suspected level of the carina.  Heart size is within normal limits.  Some rotation towards the right is present and prominence of the mediastinal contour is felt likely to be positional.  The visualized lung fields are clear with no pneumothorax, focal infiltrate, pleural fluid or congestive failure.  Visualized bony structures appear intact.  IMPRESSION: Rotated film with artifact from the trauma stretcher somewhat compromising evaluation.  No obvious focal abnormalitie is seen.  Endotracheal tube position as above.  Consider repeating the exam once the patient is stabilized for improved assessment   Original Report Authenticated By: Rhodia Albright, M.D.     CXR: ETT in position, no infx  ASSESSMENT / PLAN:  PULMONARY A:Acute resp failure - intubated for  Airway protection P:   PRVC/ vent  bundle  CARDIOVASCULAR A: Hypertension P:  Labetalolo/ hydralazine prn for SBP 180 & above  RENAL A:  Hypokalemia P:   replete  GASTROINTESTINAL A:  No issues P:   Npo for now  HEMATOLOGIC A:  No issues P:  Follow cbc  INFECTIOUS A:  Aspiration pneumonitis P:   unasyn per pharmacy  ENDOCRINE A:  DM-2   P:   SSI - hold lantus  NEUROLOGIC A:  Massive ICH with IV/ subarachnoid extension ? Aneurysmal dmentia P:   Not a candidate for neurosx intervention Neuro to manage  TODAY'S SUMMARY: Await family arrival to discuss plan of care.   I have personally obtained a history, examined the patient, evaluated laboratory and imaging results, formulated the assessment and plan and placed orders. CRITICAL CARE: The patient is critically ill with multiple organ systems failure and requires high complexity decision making for assessment and support, frequent evaluation and titration of therapies, application of advanced monitoring technologies and extensive interpretation of multiple databases. Critical Care Time devoted to patient care services described in this note is 60 minutes.   Oretha Milch  Pulmonary and Critical Care Medicine [facility] Pager: [phone]  09/26/2012, 4:36 PM

## 2012-10-09 NOTE — ED Notes (Signed)
The minister has talked to a sister who lives in town  And she will be here in 30 minutes

## 2012-10-09 NOTE — ED Notes (Signed)
Propofol started at 10ml.  edp estimated her weight at 200lbs

## 2012-10-09 NOTE — ED Notes (Signed)
og tube inserted # 16.  Placement checked. Connected to  Suction.  Minimal drainage from the tube

## 2012-10-09 NOTE — Code Documentation (Signed)
72 year old female who was sitting at home speaking with her minister when he witnessed her c/o sudden onset severe headache and then became unresponsive.  [facility] was called and code stroke was called in the field at 1534 with ETA 15 mins. Patient vomited  Patient arrived to ED at 1554.  LSW witnessed at 1502.  Stroke team arrived at 1554.  Neuro MD here at 1554.  On arrival to ED patient was being bagged by EMS - unresponsive with little gag reflex per EMS report.  She did have some purposeful movement with her right arm - attempting to pull at nasal airway but otherwise stuporous with little airway protection.  Dr. Rubin Payor EDP present - Dr. Roseanne Reno neurology present - decision to intubate patient prior to CT scan for airway protection.  Post intubation patient to CT scan at 1630.  On Propofol drip.  CT scan shows large ICH and SAH bleed.

## 2012-10-09 NOTE — Progress Notes (Signed)
ANTIBIOTIC CONSULT NOTE - INITIAL  Pharmacy Consult for unasyn Indication: aspiration PNA  No Known Allergies  Patient Measurements: weight ~76 kg, height ~63 inches    Vital Signs: Temp: 96.2 F (35.7 C) (08/22 1734) BP: 147/84 mmHg (08/22 1734) Pulse Rate: 5 (08/22 1734) Intake/Output from previous day:   Intake/Output from this shift: Total I/O In: 10 [Other:10] Out: -   Labs:  Recent Labs  10/16/2012 1603 10/16/2012 1606  WBC 8.4  --   HGB 13.1 12.9  PLT 228  --   CREATININE 0.94 1.10   The CrCl is unknown because both a height and weight (above a minimum accepted value) are required for this calculation. No results found for this basename: VANCOTROUGH, VANCOPEAK, VANCORANDOM, GENTTROUGH, GENTPEAK, GENTRANDOM, TOBRATROUGH, TOBRAPEAK, TOBRARND, AMIKACINPEAK, AMIKACINTROU, AMIKACIN,  in the last 72 hours   Microbiology: No results found for this or any previous visit (from the past 720 hour(s)).  Medical History: Past Medical History  Diagnosis Date  . Arthritis   . Hypertension   . Dyslipidemia   . HH (hiatus hernia)   . Hemorrhoids   . Osteoporosis     OSTEOPENIA  . Smoker     QUIT IN 05/2007, Restarted  . ASHD (arteriosclerotic heart disease)     Distal circ occlusion 2010, nonobstructive disease elsewhere  . Incontinence   . Memory loss 05/11/2012  . Chronic depression 05/11/2012  . Hallucinations 05/11/2012  . OSA on CPAP 05/11/2012  . Tremor, essential 05/11/2012   Assessment: Patient is a 72 y.o F presented to the ED as a code stroke. Head CT with noted intraventricular, subarachnoid, and a large large intraparenchymal hemorrhage with "significant midline shift."  Patient aspirated during intubation procedure. To start unasyn for empiric coverage.   Plan:  1) Unasyn 3gm IV q6h  Precious Gilchrest P ,5:36 PM

## 2012-10-09 NOTE — ED Notes (Signed)
Head elevated

## 2012-10-09 NOTE — Progress Notes (Signed)
Pt. was referred to [facility] due to low GC scale (3).

## 2012-10-09 NOTE — Significant Event (Signed)
Family has arrived, and indicated to bedside staff that they are ready to proceed with withdrawal of ventilator support.  Orders placed for vent withdrawal, DNR status, ativan/morphine gtt's as needed, atropine sublingual gtt as needed for respiratory secretions, and PR tylenol prn for fever.  Coralyn Helling, MD 2012-10-25, 11:58 PM

## 2012-10-09 NOTE — ED Notes (Signed)
Cc doctor in the room with pt.  elink set-up with dr Delford Field

## 2012-10-09 NOTE — ED Notes (Signed)
The preacher at bedside has called [facility] and someone is attempting to get in touch with the daughter.  No answer yet

## 2012-10-09 NOTE — ED Notes (Signed)
unasyn started iv

## 2012-10-09 NOTE — Progress Notes (Signed)
Chaplain responded to call from ED.  Renato Gails and church member were at bedside.  Spiritual conversation and prayer.  The search to locate family members is in process.  Comfort measures for church friend and prayer.  Rev. Nelson, [place] [phone]

## 2012-10-10 DIAGNOSIS — I609 Nontraumatic subarachnoid hemorrhage, unspecified: Secondary | ICD-10-CM

## 2012-10-10 MED FILL — Medication: Qty: 1 | Status: AC

## 2012-10-10 NOTE — Progress Notes (Signed)
As per Coy Saunas from bed placement,she instructed the nurse for this patient that we need to call a medical examiner,complied with the instruction ,called the medical examiner office ans spoke to Kim McNeal,according to him,this patient is not a medical examiner case.

## 2012-10-10 NOTE — Progress Notes (Signed)
Called into the patient's room by her nurse,found patient on position of comfort,pale looking,body warm but eyes were fixed,negative for blink reflexes,negative for any signs of breathing,negative for any trace of heart beating on auscultation on all chest fields,negative for any signs of vitals for life.Above findings confirmed by Konrad Dolores.Time of [phone].

## 2012-10-10 NOTE — Progress Notes (Signed)
10/11/2012 1553 nsg To unit palliative patient accompanied by RN and NT per bed with ongoing Morphine drip infusing well. Agonal breathing noted, unresponsive ; pt is DNR.bruising noted on right hand  No other skin issues noted. Kept patient comfortable, Emotional support given to patients daughter.

## 2012-10-10 NOTE — Progress Notes (Signed)
Nutrition Brief Note  RD drawn to chart for pt on ventilator. Chart reviewed. Discussed with sister at bedside.  Pt has recently ben extubated for terminal wean and is now comfort care.  No further nutrition interventions warranted at this time.  Please re-consult as needed.   Loyce Dys, MS RD LDN Clinical Inpatient Dietitian Pager: [phone] Weekend/After hours pager: [phone]

## 2012-10-10 NOTE — ED Provider Notes (Signed)
I saw and evaluated the patient, reviewed the resident's note and I agree with the findings and plan. Patient with altered mental status it began while she was visiting with her pastor. Large intracranial hemorrhage. 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 Discussed with neurology and neurosurgery. This is likely not salvageable. No surgical intervention. No more her old measures will be taken. Family was notified. Admitted to the ICU due to the intubation.  [name]. Rubin Payor, MD 10-29-12 [phone]

## 2012-10-10 NOTE — Progress Notes (Signed)
Pt's sister was bedside when I arrived. She was tearful and shared that she has another sister who has cancer and they had just learned her condition is worsening when they rec'd call of pt's stroke. Pt's sister was tearful as she shared she is losing both sisters. She will be the only surviving girl. She spoke with both sisters daily. Provided listening and compassion to pt's sister along w/prayer. Pt's sister very concerned and tearful.  Marjory Lies Chaplain  09/22/2012 0900  Clinical Encounter Type  Visited With Family;Patient and family together

## 2012-10-10 NOTE — Procedures (Signed)
Extubation Procedure Note  Patient Details:   Name: [name] DOB: 12/07/1940 MRN: [id number]   Airway Documentation:  Airway 6.5 mm (Active)  Secured at (cm) 21 cm 09/25/2012  7:25 PM  Measured From Lips 10/15/2012  7:25 PM  Secured Location Left 09/25/2012  7:25 PM  Secured By [employer] 10/01/2012  7:25 PM  Tube Holder Repositioned Yes 09/23/2012  7:25 PM    Evaluation  O2 sats: stable throughout Complications: No apparent complications Patient did tolerate procedure well. Bilateral Breath Sounds: Rhonchi Suctioning: Oral;Airway No  Patient extubated to per MD order for withdrawel of life sustaining measures.  Suctioned down ET tube and Mouth prior to extubation. RN at bedside.  Patient comfortable postextubation.  Family at bedside.  Gabriela Eves 09/20/2012, 12:26 AM

## 2012-10-10 NOTE — Progress Notes (Signed)
09/27/2012 1844 nsg Patient expired pronounced by 2 RN's Konrad Dolores and [place]. Family members in room. Dr. Noralee Chars notified at 1851. Morphine Drip wasted with Roxanne Mins RN 125 cc.

## 2012-10-10 NOTE — Progress Notes (Signed)
As per patients' family the funeral home will be picking up patient from the unit. Eye care done , eyes covered with  Gauze 2 x 2 , PIV x 2 removed from bilateral arm and foley cath removed, all parts accounted for. Pt is clean and tidy.

## 2012-10-10 NOTE — Progress Notes (Signed)
Called [employer] a referral [phone] per Vonita Moss is suitable for eyes and tissue donor.R.N.  Made aware of this and given instruction for post mortem eye care" 3 drops of normal saline into patients's eyes then 2 layers of 2 x 2 gauze on top of each eyes,before patient be enclosed on cadaver bag.

## 2012-10-10 NOTE — H&P (Signed)
PULMONARY  / CRITICAL CARE MEDICINE  Name: [name] MRN: [id number] DOB: May 13, 1940    ADMISSION DATE:  10/13/2012 CONSULTATION DATE:  2012-11-05   REFERRING MD :  EDP PRIMARY SERVICE: PCCM  CHIEF COMPLAINT:  unresponsive  BRIEF PATIENT DESCRIPTION: 72/F, smoker with  Dementia BIBEMS from her pastor's office due to sudden onset unresponsiveness, bagged on arrival with vomiting , intubated  For airway protection. Flaccid extremities with NIHSS 26. BP 160 sys on arrival, head CT -large ICH with IV & sub arachnoid extension  SIGNIFICANT EVENTS / STUDIES:  8/22 head CT >>Large intraparenchymal hemorrhage noted in right parietal and temporal lobes resulting in significant midline shift to the left.  Intraventricular and subarachnoid hemorrhage is also noted.   LINES / TUBES: ETT 6.5 8/22 >>  CULTURES: resp 8/22 >>  ANTIBIOTICS: unasyn 8/22 >>8/22  VITAL SIGNS: Temp:  [95.2 F (35.1 C)-96.6 F (35.9 C)] 96.3 F (35.7 C) (08/22 1850) Pulse Rate:  [51-110] 69 (08/23 0734) Resp:  [16-23] 22 (08/23 0734) BP: (117-155)/(58-84) 147/66 mmHg (08/23 0000) SpO2:  [89 %-100 %] 89 % (08/23 0734) FiO2 (%):  [100 %] 100 % (08/23 0000) Weight:  [169 lb 1.5 oz (76.7 kg)] 169 lb 1.5 oz (76.7 kg) (08/22 1900)  VENTILATOR SETTINGS: Vent Mode:  [-] PRVC FiO2 (%):  [100 %] 100 % Set Rate:  [16 bmp] 16 bmp Vt Set:  [500 mL] 500 mL PEEP:  [5 cmH20] 5 cmH20 Plateau Pressure:  [15 cmH20-17 cmH20] 17 cmH20  INTAKE / OUTPUT: Intake/Output     08/22 0701 - 08/23 0700 08/23 0701 - 08/24 0700   I.V. (mL/kg) 14 (0.2) 5.2 (0.1)   Other 10    Total Intake(mL/kg) 24 (0.3) 5.2 (0.1)   Urine (mL/kg/hr) 700    Total Output 700     Net -676 +5.2          PHYSICAL EXAMINATION: Gen.  well-nourished, in no distress ENT - no lesions, no post nasal drip, pupils 3mm NRTL Neck: No JVD, no thyromegaly, no carotid bruits Lungs: resp's even/non-labored, coarse bilaterally Cardiovascular: Rhythm  regular, heart sounds  normal, no murmurs, no peripheral edema Abdomen: soft and non-tender, no hepatosplenomegaly, BS normal. Musculoskeletal: No deformities, no cyanosis or clubbing Neuro:  Comatose, appears comfortable, NAD Skin:  Warm, no lesions/ rash   LABS:  CBC Recent Labs     10/04/2012  1603  10/17/2012  1606  WBC  8.4   --   HGB  13.1  12.9  HCT  36.5  38.0  PLT  228   --    Coag's Recent Labs     09/23/2012  1603  APTT  25  INR  0.99   BMET Recent Labs     10/11/2012  1603  10/04/2012  1606  NA  137  138  K  3.4*  3.4*  CL  100  102  CO2  24   --   BUN  9  8  CREATININE  0.94  1.10  GLUCOSE  181*  178*   Electrolytes Recent Labs     10/05/2012  1603  CALCIUM  9.9   Imaging Ct Head Wo Contrast     *RADIOLOGY REPORT*  Clinical Data: Headache, unresponsive.  CT HEAD WITHOUT CONTRAST  Technique:  Contiguous axial images were obtained from the base of the skull through the vertex without contrast.  Comparison: February 22, 2010.  Findings: Bony calvarium appears intact.  Large intraparenchymal hemorrhage is seen in  the right parietal temporal lobes measuring 5.0 x 4.6 cm.  This results in 12 mm of midline shift to the left. Hemorrhage is also noted in the right lateral ventricle, third ventricle and fourth ventricle.  Subarachnoid hemorrhage is noted bilaterally in the sylvian fissures, basal cisterns and anterior interhemispheric fissure.  IMPRESSION: Large intraparenchymal hemorrhage noted in right parietal and temporal lobes resulting in significant midline shift to the left. Intraventricular and subarachnoid hemorrhage is also noted.  Critical Value/emergent results were called by telephone at the time of interpretation on 10/18/12 at 04:55 p.m. to Dr. Lu Duffel, who verbally acknowledged these results.   Original Report Authenticated By: Lupita Raider.,  M.D.   [facility] 1 View  Oct 18, 2012   *RADIOLOGY REPORT*  Clinical Data: Weakness.  Intubation.   PORTABLE CHEST - 1 VIEW  Comparison: 01/29/2010  Findings: A metallic bar artifact is present over the mid aspect of the chest due to artifact from the trauma stretcher.  The image was not repeated as the patient was getting ready to be transported to CT.  An endotracheal tube is in place and the tip projects over the distal trachea approximately 1 cm above the suspected level of the carina.  Heart size is within normal limits.  Some rotation towards the right is present and prominence of the mediastinal contour is felt likely to be positional.  The visualized lung fields are clear with no pneumothorax, focal infiltrate, pleural fluid or congestive failure.  Visualized bony structures appear intact.  IMPRESSION: Rotated film with artifact from the trauma stretcher somewhat compromising evaluation.  No obvious focal abnormalitie is seen.  Endotracheal tube position as above.  Consider repeating the exam once the patient is stabilized for improved assessment   Original Report Authenticated By: Rhodia Albright, M.D.     CXR: ETT in position, no infx  ASSESSMENT / PLAN:   NEUROLOGIC A:   Massive ICH with IV/ subarachnoid extension ? Aneurysmal Dementia P:   Not a candidate for neurosx intervention Appreciate Neuro Input Morphine for comfort care measures   PULMONARY A: Acute resp failure - intubated for airway protection Aspiration Pneumonitis P:   Morphine for respiratory rate of 12-20 Hold further abx Atropine gtts for increased secretions  CARDIOVASCULAR A:  Hypertension P:  Hold further BP rx / comfort measures   ENDOCRINE A:  DM-2   P:   No further labs / SSI   Family updated at bedside - daughter and sister.  Discussed EOL process / what to expect.  Continue morphine for comfort.  Will monitor.   Canary Brim, NP-C [facility] Pgr: [phone] or [phone]  Plan tfr to floor and pall care  09/22/2012, 8:13 AM

## 2012-10-10 NOTE — Progress Notes (Signed)
Patient's body remain in room with family members. They are presently awaiting the arrival of patients' son.  Courtesy Tray provided.

## 2012-10-12 ENCOUNTER — Telehealth: Payer: Self-pay | Admitting: *Deleted

## 2012-10-12 LAB — URINE CULTURE: Colony Count: >=100000

## 2012-10-12 NOTE — Telephone Encounter (Signed)
Angie from [facility] calling about pt and her aricept and why given.  (for alz dementia with lewy-body)?  I gave her information re: Dr. Imagene Gurney note in [facility] EMR.  She verbalized understanding and if needed will call back.  She did have access to [facility] records.

## 2012-10-14 NOTE — Discharge Summary (Signed)
  Physician Discharge Summary     Patient ID: CASH MEADOW MRN: [id number] DOB/AGE: Nov 28, 1940 72 y.o. Death Summary Admit date: 11-02-12 [place] date:09/26/2012  Discharge Diagnoses:  Active Problems:   Intracerebral hemorrhage   Subarachnoid hemorrhage   Intraventricular hemorrhage   Acute respiratory failure   Detailed Hospital Course:  This is a 72 year old female admitted from the emergency room with acute intracranial hemorrhage. The patient did not awaken and end-of-life discussions were held with family and we elected to make the patient comfort care. The patient was taken off life support and transferred to the pelvic care bed whereupon the patient expired at 1844 on 09/21/2012   Discharge Exam: BP 125/59  Pulse 72  Temp(Src) 96.3 F (35.7 C) (Core (Comment))  Resp 18  Ht 5\' 3"  (1.6 m)  Wt 169 lb 1.5 oz (76.7 kg)  BMI 29.96 kg/m2  SpO2 86%  Pt is deceased  Labs at discharge Lab Results  Component Value Date   CREATININE 1.10 2012/11/02   BUN 8 11-02-12   NA 138 02-Nov-2012   K 3.4* 2012-11-02   CL 102 11-02-12   CO2 24 11/02/12   Lab Results  Component Value Date   WBC 8.4 02-Nov-2012   HGB 12.9 11-02-12   HCT 38.0 11-02-2012   MCV 86.7 November 02, 2012   PLT 228 2012/11/02   Lab Results  Component Value Date   ALT 12 Nov 02, 2012   AST 20 2012-11-02   ALKPHOS 70 11/02/12   BILITOT 0.3 November 02, 2012   Lab Results  Component Value Date   INR 0.99 11-02-2012   INR 1.0 10/31/2008   INR 1.0 05/09/2008    Current radiology studies No results found.  Disposition:  20-Expired   Signed: Shan Levans 10/14/2012, 1:29 PM

## 2012-10-19 DEATH — deceased

## 2012-11-11 ENCOUNTER — Ambulatory Visit: Payer: No Typology Code available for payment source | Admitting: Neurology

## 2013-01-28 ENCOUNTER — Ambulatory Visit: Payer: Medicare Other | Admitting: Neurology

## 2013-05-19 NOTE — Telephone Encounter (Signed)
Closing encounter

## 2014-07-24 IMAGING — CT CT HEAD W/O CM
1 series · 16 of 30 positions shown, 20 images · non-contrast
Comparison: February 22, 2010.

CLINICAL DATA: Headache, unresponsive.

CT HEAD WITHOUT CONTRAST
TECHNIQUE: Contiguous axial images were obtained from the base of
the skull through the vertex without contrast.

[Series 2: head 5.0 h30s · axial · 0.43mm/px · z∈[+1272,+1412]mm · 16 of 32 slices shown, 20 images]
[im 2/32  brain]
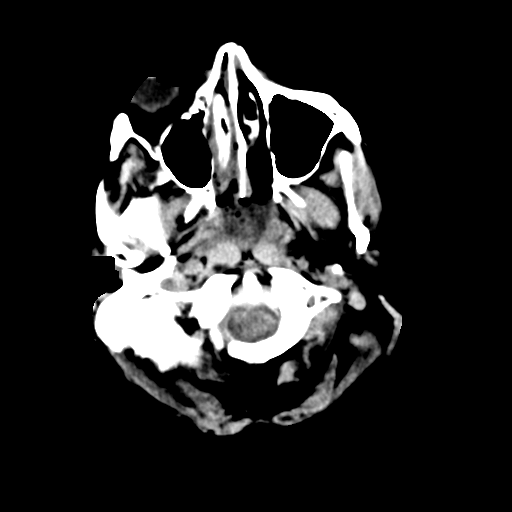
[im 2/32  bone]
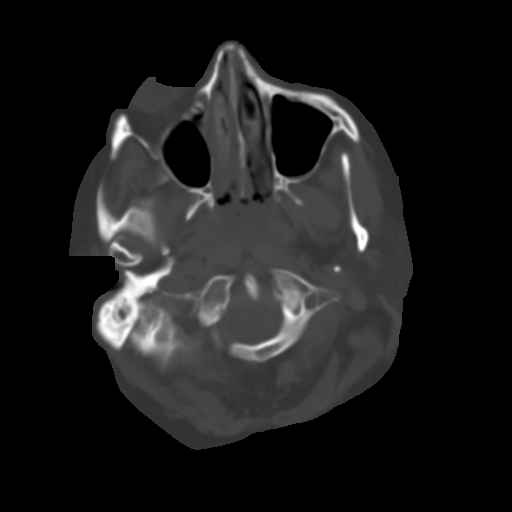
[im 4/32  brain]
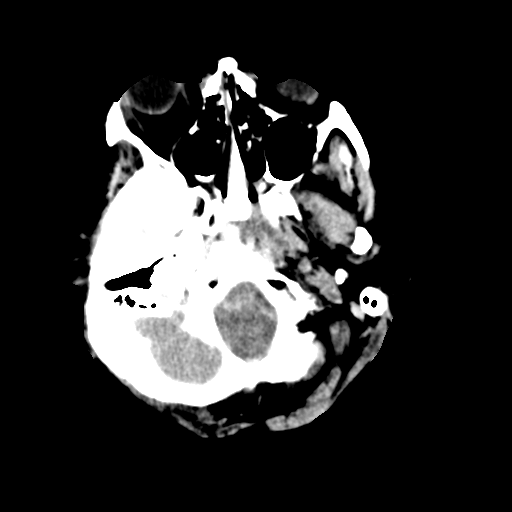
[im 6/32  brain]
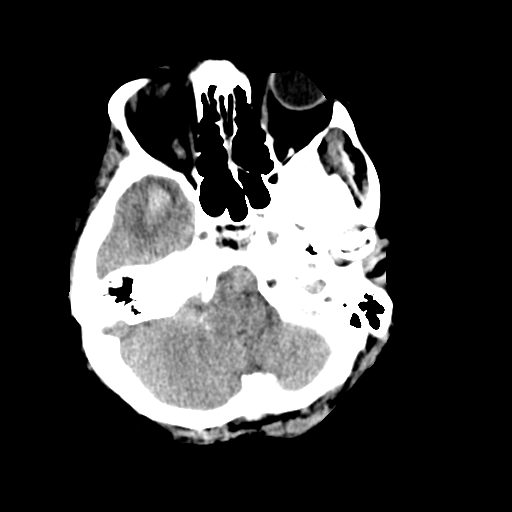
[im 8/32  brain]
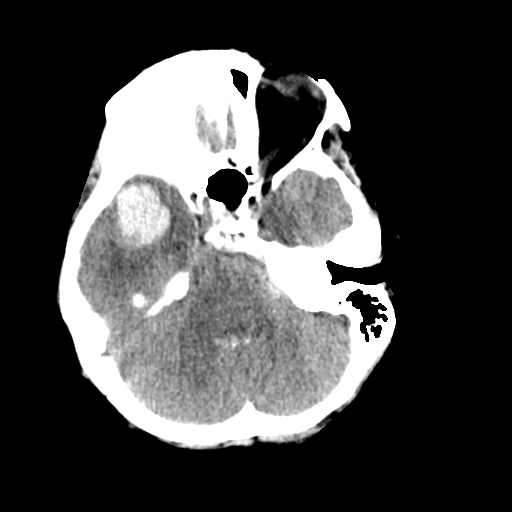
[im 9/32  brain]
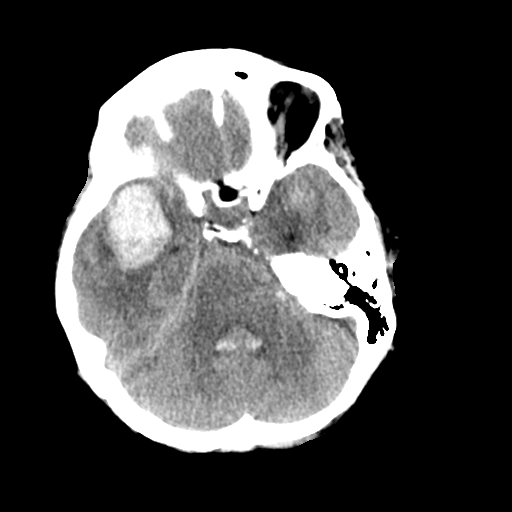
[im 9/32  bone]
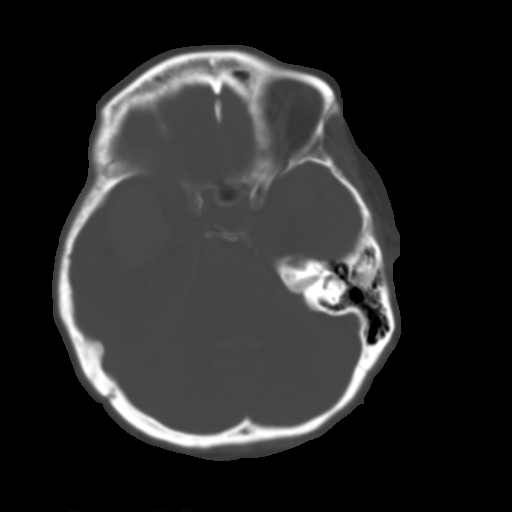
[im 11/32  brain]
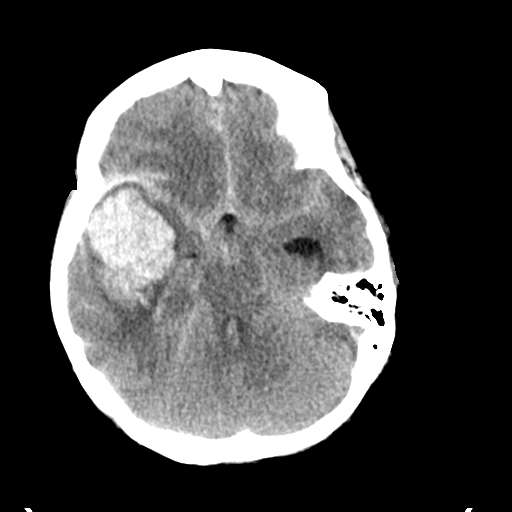
[im 13/32  brain]
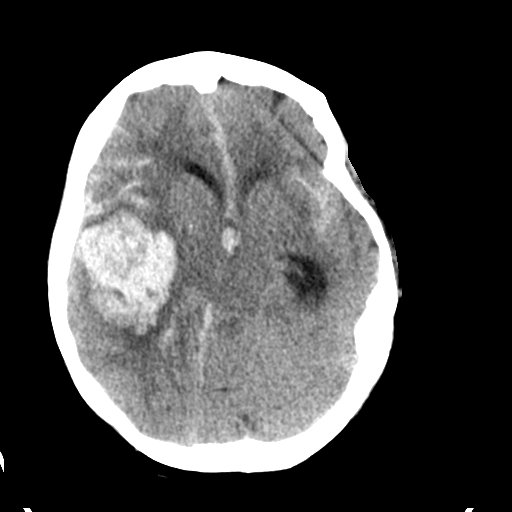
[im 15/32  brain]
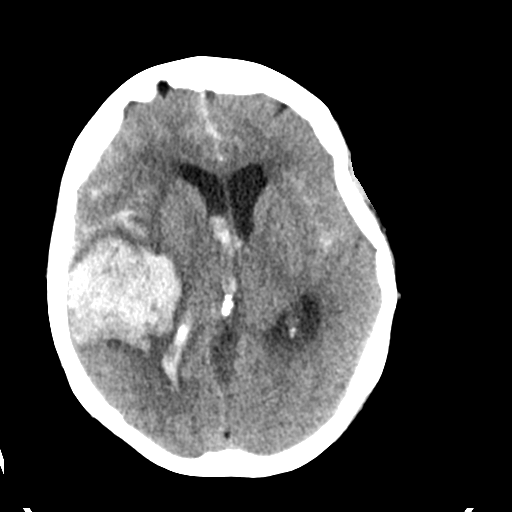
[im 17/32  brain]
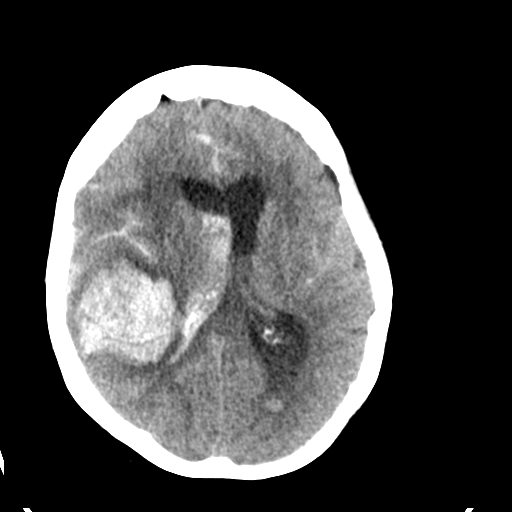
[im 17/32  bone]
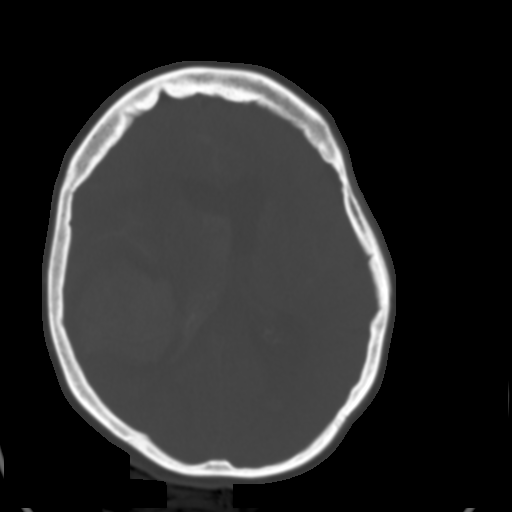
[im 19/32  brain]
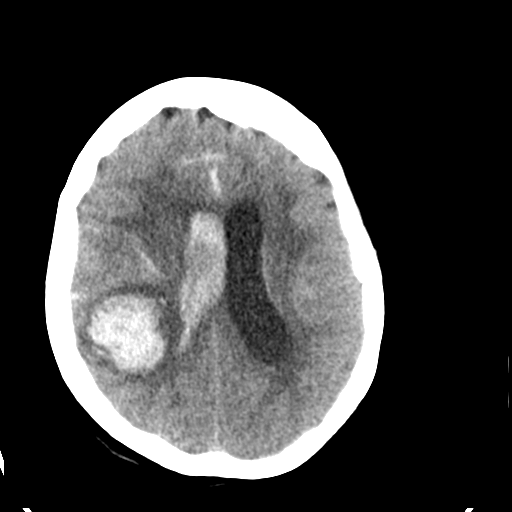
[im 21/32  brain]
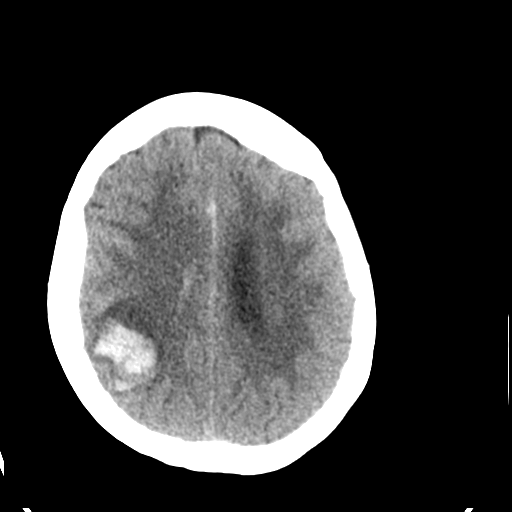
[im 23/32  brain]
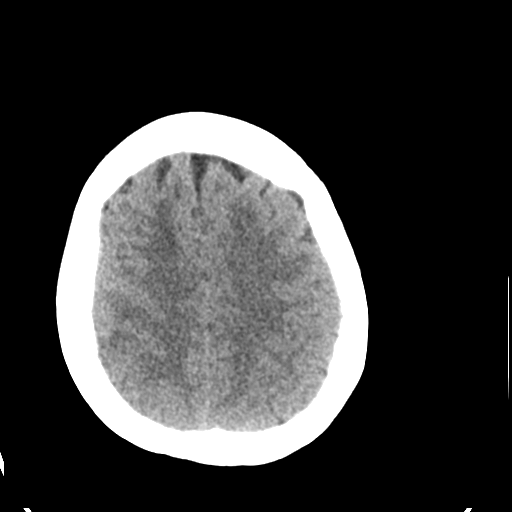
[im 24/32  brain]
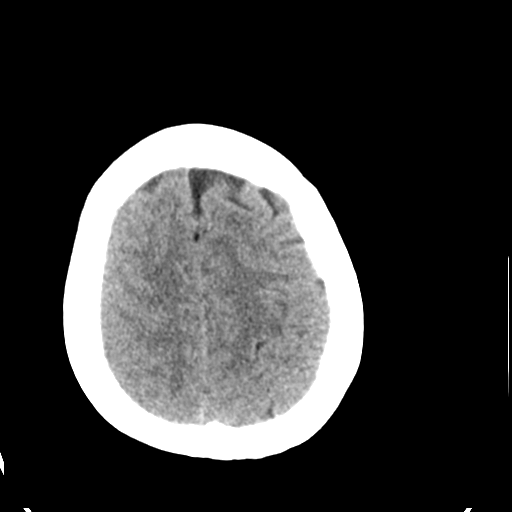
[im 24/32  bone]
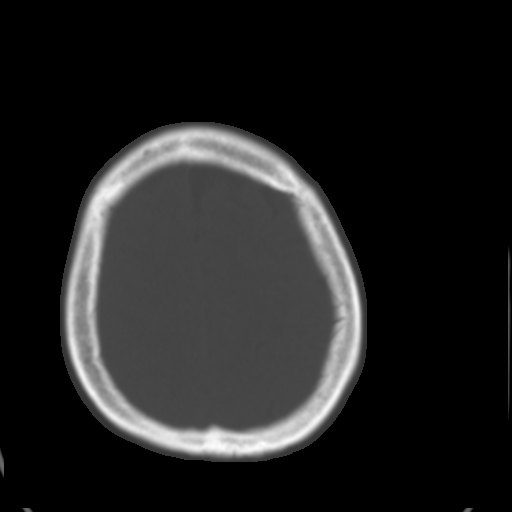
[im 26/32  brain]
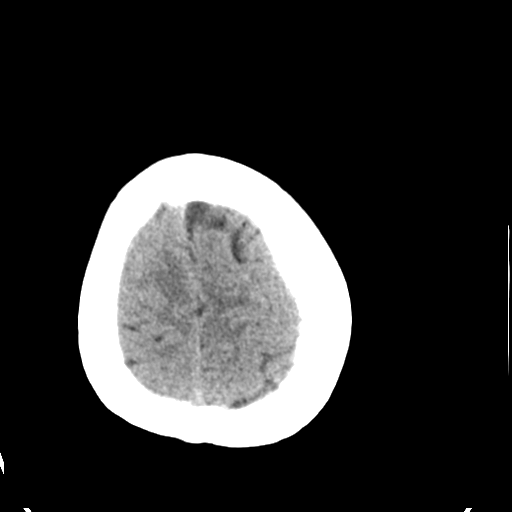
[im 28/32  brain]
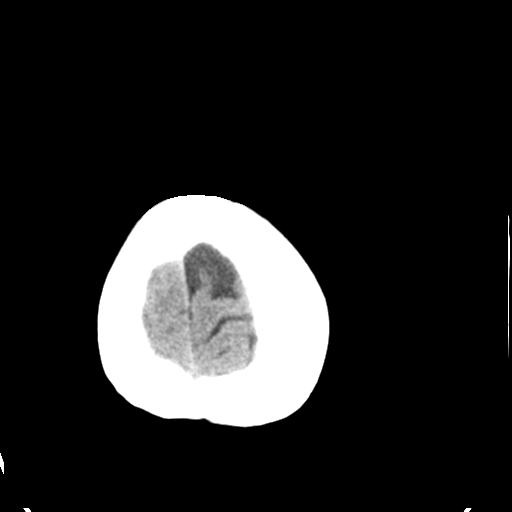
[im 30/32  brain]
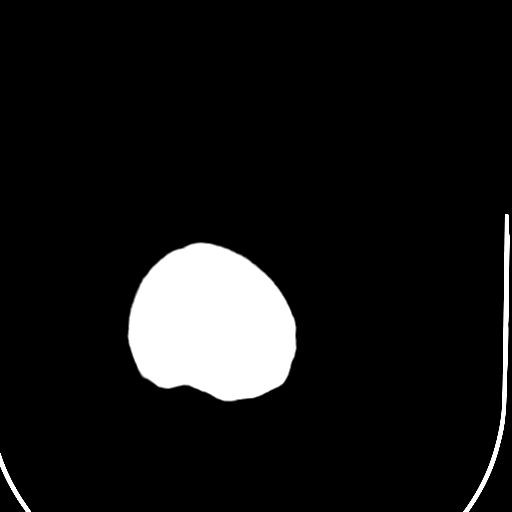

[16 of 30 positions shown; findings below may reference images not displayed]

FINDINGS: Bony calvarium appears intact.  Large intraparenchymal
hemorrhage is seen in the right parietal temporal lobes measuring
5.0 x 4.6 cm.  This results in 12 mm of midline shift to the left.
Hemorrhage is also noted in the right lateral ventricle, third
ventricle and fourth ventricle.  Subarachnoid hemorrhage is noted
bilaterally in the sylvian fissures, basal cisterns and anterior
interhemispheric fissure.
IMPRESSION: Large intraparenchymal hemorrhage noted in right parietal and
temporal lobes resulting in significant midline shift to the left.
Intraventricular and subarachnoid hemorrhage is also noted.

Critical Value/emergent results were called by telephone at the
time of interpretation on October 09, 2012 at [DATE] p.m. to Dr.
Mike, who verbally acknowledged these results.

## 2014-07-24 IMAGING — CR DG CHEST 1V PORT
1 series · 1 of 1 positions shown · non-contrast
Comparison: 01/29/2010

CLINICAL DATA: Weakness.  Intubation.

PORTABLE CHEST - 1 VIEW

[AP]
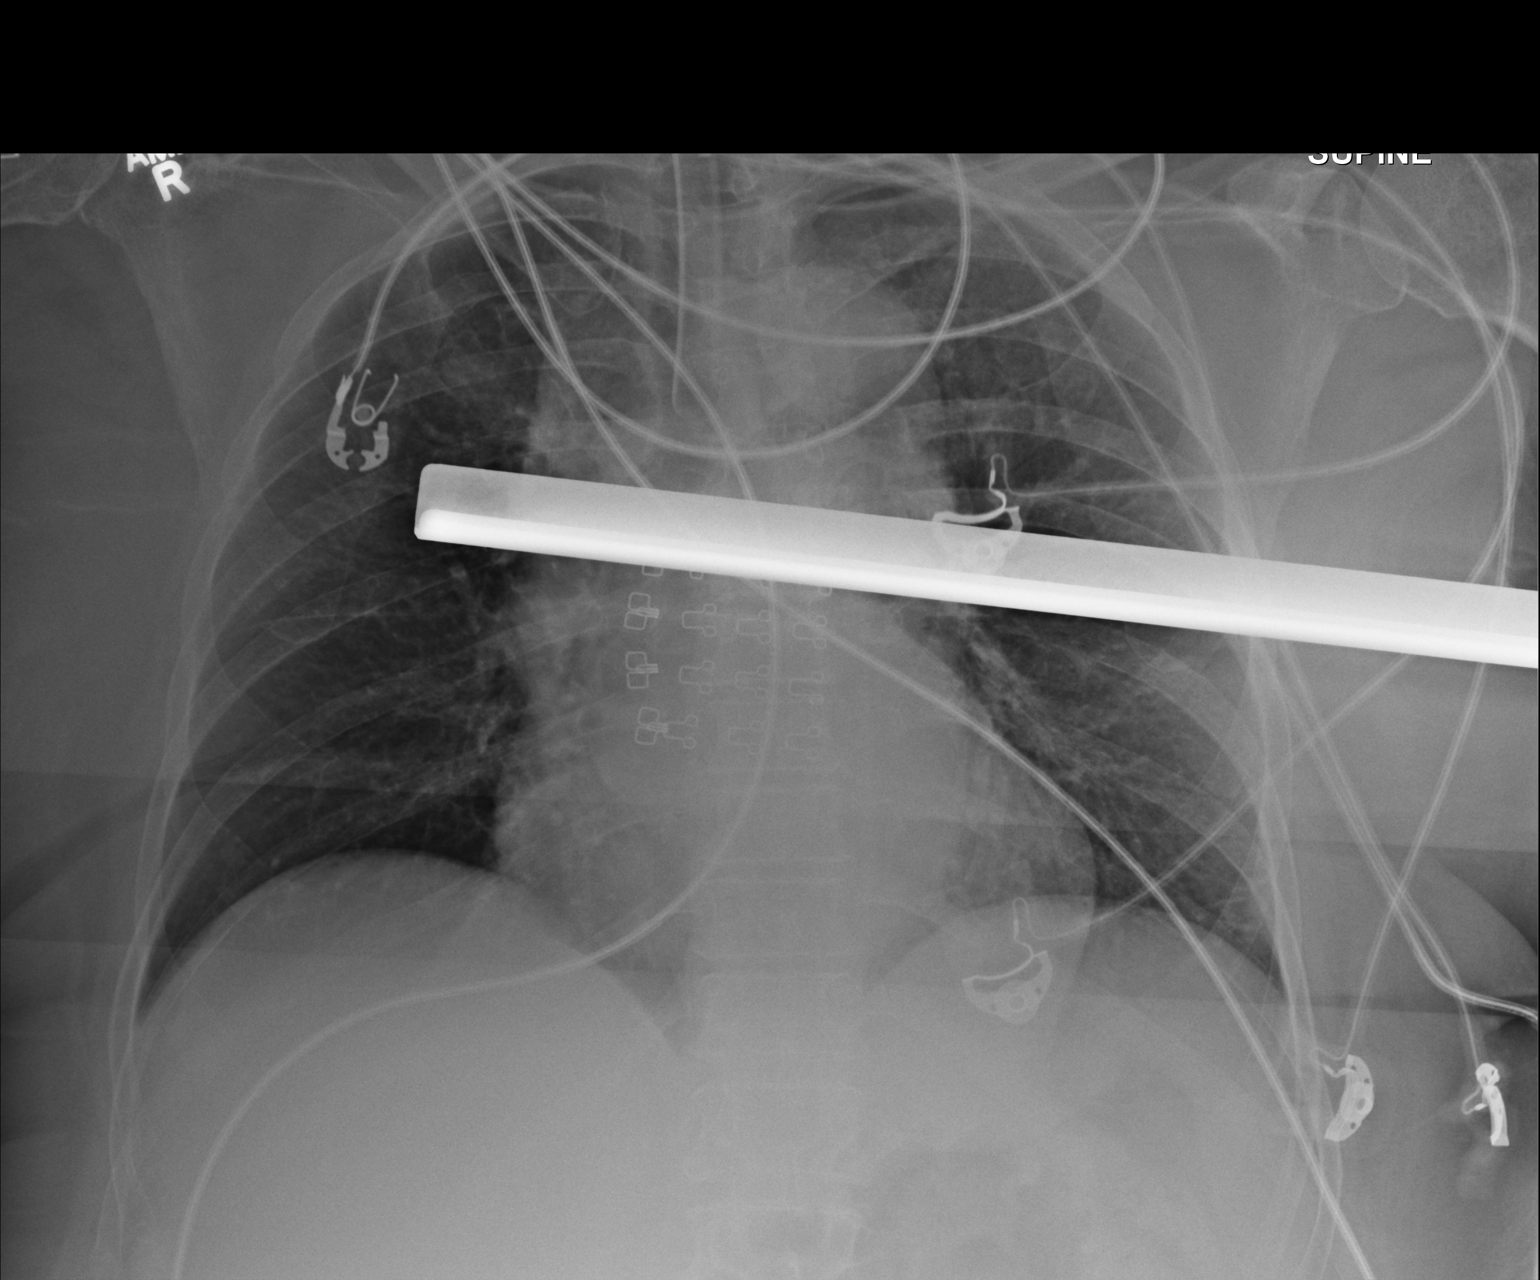

[1 of 1 positions shown; findings below may reference images not displayed]

FINDINGS: A metallic bar artifact is present over the mid aspect of
the chest due to artifact from the trauma stretcher.  The image was
not repeated as the patient was getting ready to be transported to
CT.

An endotracheal tube is in place and the tip projects over the
distal trachea approximately 1 cm above the suspected level of the
carina.  Heart size is within normal limits.  Some rotation towards
the right is present and prominence of the mediastinal contour is
felt likely to be positional.  The visualized lung fields are clear
with no pneumothorax, focal infiltrate, pleural fluid or congestive
failure.

Visualized bony structures appear intact.
IMPRESSION: Rotated film with artifact from the trauma stretcher somewhat
compromising evaluation.  No obvious focal abnormalitie is seen.

Endotracheal tube position as above.

Consider repeating the exam once the patient is stabilized for
improved assessment
